# Patient Record
Sex: Female | Born: 1985 | Race: White | Hispanic: Yes | Marital: Married | Smoking: Former smoker
Health system: Southern US, Community
[De-identification: ages and names within clinical notes are randomized; demographics above are authoritative.]

## PROBLEM LIST (undated history)

## (undated) ENCOUNTER — Inpatient Hospital Stay (HOSPITAL_COMMUNITY): Payer: Self-pay

## (undated) DIAGNOSIS — Z789 Other specified health status: Secondary | ICD-10-CM

## (undated) HISTORY — PX: DILATION AND CURETTAGE OF UTERUS: SHX78

---

## 2012-08-25 ENCOUNTER — Inpatient Hospital Stay (HOSPITAL_COMMUNITY)
Admission: AD | Admit: 2012-08-25 | Discharge: 2012-08-25 | Disposition: A | Discharge status: Home / Self Care | Payer: Self-pay | Source: Ambulatory Visit | Attending: Obstetrics & Gynecology | Admitting: Obstetrics & Gynecology

## 2012-08-25 ENCOUNTER — Inpatient Hospital Stay (HOSPITAL_COMMUNITY): Payer: Self-pay

## 2012-08-25 ENCOUNTER — Encounter (HOSPITAL_COMMUNITY): Payer: Self-pay | Admitting: *Deleted

## 2012-08-25 DIAGNOSIS — O209 Hemorrhage in early pregnancy, unspecified: Secondary | ICD-10-CM

## 2012-08-25 DIAGNOSIS — O26859 Spotting complicating pregnancy, unspecified trimester: Secondary | ICD-10-CM | POA: Insufficient documentation

## 2012-08-25 DIAGNOSIS — O4690 Antepartum hemorrhage, unspecified, unspecified trimester: Secondary | ICD-10-CM

## 2012-08-25 HISTORY — DX: Other specified health status: Z78.9

## 2012-08-25 LAB — WET PREP, GENITAL
Trich, Wet Prep: NONE SEEN
Yeast Wet Prep HPF POC: NONE SEEN

## 2012-08-25 LAB — URINALYSIS, ROUTINE W REFLEX MICROSCOPIC
Bilirubin Urine: NEGATIVE
Hgb urine dipstick: NEGATIVE
Ketones, ur: NEGATIVE mg/dL
Nitrite: NEGATIVE
pH: 7.5 (ref 5.0–8.0)

## 2012-08-25 LAB — CBC
HCT: 40.6 % (ref 36.0–46.0)
Hemoglobin: 14 g/dL (ref 12.0–15.0)
MCV: 87.1 fL (ref 78.0–100.0)
RDW: 13.8 % (ref 11.5–15.5)
WBC: 6.7 10*3/uL (ref 4.0–10.5)

## 2012-08-25 LAB — HCG, QUANTITATIVE, PREGNANCY: hCG, Beta Chain, Quant, S: 135 m[IU]/mL — ABNORMAL HIGH (ref <5)

## 2012-08-25 NOTE — MAU Provider Note (Signed)
History     CSN: [id number]  Arrival date and time: 08/25/12 1147   None     Chief Complaint  Patient presents with  . Possible Pregnancy   HPI This is a 27 y.o. female at early gestation who presents with report of spotting. Denies pain or fever. Has not received care anywhere yet   RN Note: Pt reports she had a positive HPT and started spotting today. Denies pain or cramping.      OB History   Grav Para Term Preterm Abortions TAB SAB Ect Mult Living   3 1 1  2  2   1       Past Medical History  Diagnosis Date  . Medical history non-contributory     Past Surgical History  Procedure Laterality Date  . Cesarean section      No family history on file.  History  Substance Use Topics  . Smoking status: Former Smoker    Quit date: 08/20/2012  . Smokeless tobacco: Not on file  . Alcohol Use: No    Allergies: Allergies not on file  No prescriptions prior to admission    Review of Systems  Constitutional: Negative for fever, chills and malaise/fatigue.  Gastrointestinal: Negative for nausea, vomiting, abdominal pain, diarrhea and constipation.  Genitourinary:       Spotting  Neurological: Negative for dizziness and weakness.   Physical Exam   Blood pressure 114/63, pulse 77, temperature 98.1 F (36.7 C), temperature source Oral, resp. rate 18, height 5\' 3"  (1.6 m), weight 58.786 kg (129 lb 9.6 oz), last menstrual period 07/27/2012.  Physical Exam  Constitutional: She is oriented to person, place, and time. She appears well-developed and well-nourished. No distress.  Cardiovascular: Normal rate.   Respiratory: Effort normal.  GI: Soft. She exhibits no distension and no mass. There is no tenderness. There is no rebound and no guarding.  Genitourinary: Uterus normal. Vaginal discharge (small amount dark red colored discharge) found.  Uterus small,nontender Adnexae nontender, no masses  Musculoskeletal: Normal range of motion.  Neurological: She is alert  and oriented to person, place, and time.  Skin: Skin is warm and dry.  Psychiatric: She has a normal mood and affect.    MAU Course  Procedures  MDM Will check quant, CBC, ABO/Rh and [place] to rule out ectopic Results for orders placed during the hospital encounter of 08/25/12 (from the past 24 hour(s))  URINALYSIS, ROUTINE W REFLEX MICROSCOPIC     Status: Abnormal   Collection Time    08/25/12 12:00 PM      Result Value Range   Color, Urine YELLOW  YELLOW   APPearance CLEAR  CLEAR   Specific Gravity, Urine 1.010  1.005 - 1.030   pH 7.5  5.0 - 8.0   Glucose, UA NEGATIVE  NEGATIVE mg/dL   Hgb urine dipstick NEGATIVE  NEGATIVE   Bilirubin Urine NEGATIVE  NEGATIVE   Ketones, ur NEGATIVE  NEGATIVE mg/dL   Protein, ur NEGATIVE  NEGATIVE mg/dL   Urobilinogen, UA 0.2  0.0 - 1.0 mg/dL   Nitrite NEGATIVE  NEGATIVE   Leukocytes, UA TRACE (*) NEGATIVE  URINE MICROSCOPIC-ADD ON     Status: Abnormal   Collection Time    08/25/12 12:00 PM      Result Value Range   Squamous Epithelial / LPF FEW (*) RARE   WBC, UA 3-6  <3 WBC/hpf   Bacteria, UA FEW (*) RARE  POCT PREGNANCY, URINE     Status: Abnormal  Collection Time    08/25/12 12:13 PM      Result Value Range   Preg Test, Ur POSITIVE (*) NEGATIVE  HCG, QUANTITATIVE, PREGNANCY     Status: Abnormal   Collection Time    08/25/12 12:38 PM      Result Value Range   hCG, Beta Chain, Quant, S 135 (*) <5 mIU/mL  CBC     Status: None   Collection Time    08/25/12 12:38 PM      Result Value Range   WBC 6.7  4.0 - 10.5 K/uL   RBC 4.66  3.87 - 5.11 MIL/uL   Hemoglobin 14.0  12.0 - 15.0 g/dL   HCT 16.1  09.6 - 04.5 %   MCV 87.1  78.0 - 100.0 fL   MCH 30.0  26.0 - 34.0 pg   MCHC 34.5  30.0 - 36.0 g/dL   RDW 40.9  81.1 - 91.4 %   Platelets 272  150 - 400 K/uL  ABO/RH     Status: None   Collection Time    08/25/12 12:38 PM      Result Value Range   ABO/RH(D) A POS    WET PREP, GENITAL     Status: Abnormal   Collection Time    08/25/12  12:50 PM      Result Value Range   Yeast Wet Prep HPF POC NONE SEEN  NONE SEEN   Trich, Wet Prep NONE SEEN  NONE SEEN   Clue Cells Wet Prep HPF POC FEW (*) NONE SEEN   WBC, Wet Prep HPF POC FEW (*) NONE SEEN   US Ob Transvaginal  08/25/2012   *RADIOLOGY REPORT*  Clinical Data: bleeding early preg, r/o ectopic,bleeding, r/o ectopic; ;  OBSTETRIC <14 WK [place] AND TRANSVAGINAL OB US  Technique: Both transabdominal and transvaginal ultrasound examinations were performed for complete evaluation of the gestation as well as the maternal uterus, adnexal regions, and pelvic cul-de-sac.  Comparison: None.  Findings: No intrauterine gestational sac is visualized.  Uterus is unremarkable.  Endometrium 14 mm in thickness and homogeneous.  Ovaries are symmetric in size and echotexture.  Small left corpus luteal cyst.  No adnexal masses.  No free fluid.  IMPRESSION: No intrauterine gestation.  Differential considerations would include intrauterine gestation too early to visualize, spontaneous abortion, or occult ectopic pregnancy.  Recommend continued close follow-up with serial quantitative HCGs and ultrasounds.   Original Report Authenticated By: Charlett Nose, M.D.   Assessment and Plan  A:  Pregnancy at 4.[redacted] weeks gestation      Spotting   P:  Discharge home       Repeat quant HCG in 48 hrs       Repeat US in 7-10 days  [facility] 08/25/2012, 12:56 PM

## 2012-08-25 NOTE — MAU Note (Signed)
Pt reports she had a positive HPT and started spotting today. Denies pain or cramping.

## 2012-08-27 ENCOUNTER — Encounter (HOSPITAL_COMMUNITY): Payer: Self-pay

## 2012-08-27 ENCOUNTER — Inpatient Hospital Stay (HOSPITAL_COMMUNITY)
Admission: AD | Admit: 2012-08-27 | Discharge: 2012-08-27 | Disposition: A | Discharge status: Home / Self Care | Payer: Self-pay | Source: Ambulatory Visit | Attending: Obstetrics and Gynecology | Admitting: Obstetrics and Gynecology

## 2012-08-27 DIAGNOSIS — O26851 Spotting complicating pregnancy, first trimester: Secondary | ICD-10-CM

## 2012-08-27 DIAGNOSIS — O26859 Spotting complicating pregnancy, unspecified trimester: Secondary | ICD-10-CM | POA: Insufficient documentation

## 2012-08-27 LAB — URINE CULTURE: Colony Count: 95000

## 2012-08-27 LAB — HCG, QUANTITATIVE, PREGNANCY: hCG, Beta Chain, Quant, S: 441 m[IU]/mL — ABNORMAL HIGH

## 2012-08-27 LAB — GC/CHLAMYDIA PROBE AMP
CT Probe RNA: NEGATIVE
GC Probe RNA: NEGATIVE

## 2012-08-27 NOTE — MAU Provider Note (Signed)
History   Chief Complaint:  Follow-up   Paige Love is  27 y.o. [id number] Patient's last menstrual period was 07/27/2012.Marland Kitchen Patient is here for follow up of quantitative HCG and ongoing surveillance of pregnancy status.   She is [redacted]w[redacted]d weeks gestation  by LMP.    Since her last visit, the patient is without new complaint.   The patient reports bleeding as  spotting.    General ROS:  negative  Her previous Quantitative HCG values are:  08/25/12: 135   Physical Exam   Blood pressure 117/65, pulse 94, temperature 99.2 F (37.3 C), temperature source Oral, resp. rate 16, last menstrual period 07/27/2012, SpO2 100.00%.  Focused Gynecological Exam: examination not indicated  Labs: Results for orders placed during the hospital encounter of 08/27/12 (from the past 24 hour(s))  HCG, QUANTITATIVE, PREGNANCY   Collection Time    08/27/12 10:20 AM      Result Value Range   hCG, Beta Chain, Quant, S 441 (*) <5 mIU/mL    Ultrasound Studies:   US Ob Comp Less 14 Wks  08/25/2012   *RADIOLOGY REPORT*  Clinical Data: bleeding early preg, r/o ectopic,bleeding, r/o ectopic; ;  OBSTETRIC <14 WK [place] AND TRANSVAGINAL OB US  Technique: Both transabdominal and transvaginal ultrasound examinations were performed for complete evaluation of the gestation as well as the maternal uterus, adnexal regions, and pelvic cul-de-sac.  Comparison: None.  Findings: No intrauterine gestational sac is visualized.  Uterus is unremarkable.  Endometrium 14 mm in thickness and homogeneous.  Ovaries are symmetric in size and echotexture.  Small left corpus luteal cyst.  No adnexal masses.  No free fluid.  IMPRESSION: No intrauterine gestation.  Differential considerations would include intrauterine gestation too early to visualize, spontaneous abortion, or occult ectopic pregnancy.  Recommend continued close follow-up with serial quantitative HCGs and ultrasounds.   Original Report Authenticated By: Charlett Nose, M.D.   US  Ob Transvaginal  08/25/2012   *RADIOLOGY REPORT*  Clinical Data: bleeding early preg, r/o ectopic,bleeding, r/o ectopic; ;  OBSTETRIC <14 WK [place] AND TRANSVAGINAL OB US  Technique: Both transabdominal and transvaginal ultrasound examinations were performed for complete evaluation of the gestation as well as the maternal uterus, adnexal regions, and pelvic cul-de-sac.  Comparison: None.  Findings: No intrauterine gestational sac is visualized.  Uterus is unremarkable.  Endometrium 14 mm in thickness and homogeneous.  Ovaries are symmetric in size and echotexture.  Small left corpus luteal cyst.  No adnexal masses.  No free fluid.  IMPRESSION: No intrauterine gestation.  Differential considerations would include intrauterine gestation too early to visualize, spontaneous abortion, or occult ectopic pregnancy.  Recommend continued close follow-up with serial quantitative HCGs and ultrasounds.   Original Report Authenticated By: Charlett Nose, M.D.    Assessment:  [redacted]w[redacted]d weeks gestation w/ appropriate rise in quants.   Plan: The patient is instructed to follow up in in 2 weeks for viability [place]. SAB, ectopic precautions.  Paige Love 08/27/2012, 11:07 AM

## 2012-08-28 ENCOUNTER — Other Ambulatory Visit: Payer: Self-pay | Admitting: Medical

## 2012-08-28 NOTE — Progress Notes (Signed)
Patient had + urine culture. Needs Rx for Macrobid, but no pharmacy listed. Will have NP/CNM in MAU today contact with Spanish interpreter.   Freddi Starr, PA-C 08/28/2012 8:05 AM

## 2012-08-29 ENCOUNTER — Other Ambulatory Visit: Payer: Self-pay | Admitting: Advanced Practice Midwife

## 2012-08-29 DIAGNOSIS — O2341 Unspecified infection of urinary tract in pregnancy, first trimester: Secondary | ICD-10-CM

## 2012-08-29 MED ORDER — NITROFURANTOIN MONOHYD MACRO 100 MG PO CAPS: 100.0000 mg | ORAL_CAPSULE | Freq: Two times a day (BID) | ORAL | Status: DC

## 2012-08-29 NOTE — Progress Notes (Signed)
No pharmacy listed to E-Rx Macrobid for UTI. Please have interpreter in clinic call pt to inquire. Please call in Rx.

## 2012-08-30 NOTE — MAU Provider Note (Signed)
Attestation of Attending Supervision of Advanced Practitioner (CNM/NP): Evaluation and management procedures were performed by the Advanced Practitioner under my supervision and collaboration.  I have reviewed the Advanced Practitioner's note and chart, and I agree with the management and plan.  Lillianne Eick 08/30/2012 8:34 AM

## 2012-08-31 MED ORDER — NITROFURANTOIN MONOHYD MACRO 100 MG PO CAPS: 100.0000 mg | ORAL_CAPSULE | Freq: Two times a day (BID) | ORAL | Status: DC

## 2012-08-31 NOTE — Addendum Note (Signed)
Addended by: Jill Side on: 08/31/2012 12:48 PM   Modules accepted: Orders

## 2012-08-31 NOTE — Progress Notes (Signed)
Called pt with [place] interpreter # [phone] and informed pt of UTI as well as need for medication. After pt selected her pharmacy, I sent Rx electronically. She voiced understanding and will obtain Rx today and follow instructions.

## 2012-09-11 ENCOUNTER — Ambulatory Visit (HOSPITAL_COMMUNITY)
Admission: RE | Admit: 2012-09-11 | Discharge: 2012-09-11 | Disposition: A | Discharge status: Home / Self Care | Payer: Self-pay | Source: Ambulatory Visit | Attending: Advanced Practice Midwife | Admitting: Advanced Practice Midwife

## 2012-09-11 DIAGNOSIS — Z3689 Encounter for other specified antenatal screening: Secondary | ICD-10-CM | POA: Insufficient documentation

## 2012-09-11 DIAGNOSIS — O26851 Spotting complicating pregnancy, first trimester: Secondary | ICD-10-CM

## 2012-09-11 DIAGNOSIS — O34219 Maternal care for unspecified type scar from previous cesarean delivery: Secondary | ICD-10-CM | POA: Insufficient documentation

## 2012-10-23 ENCOUNTER — Encounter: Payer: Self-pay | Admitting: Family Medicine

## 2012-10-23 ENCOUNTER — Encounter: Payer: Self-pay | Admitting: *Deleted

## 2012-10-23 ENCOUNTER — Ambulatory Visit (INDEPENDENT_AMBULATORY_CARE_PROVIDER_SITE_OTHER): Payer: Self-pay | Admitting: Family Medicine

## 2012-10-23 VITALS — BP 115/72 | Temp 99.6°F | Wt 127.1 lb

## 2012-10-23 DIAGNOSIS — Z3480 Encounter for supervision of other normal pregnancy, unspecified trimester: Secondary | ICD-10-CM | POA: Insufficient documentation

## 2012-10-23 DIAGNOSIS — Z3481 Encounter for supervision of other normal pregnancy, first trimester: Secondary | ICD-10-CM

## 2012-10-23 DIAGNOSIS — O34219 Maternal care for unspecified type scar from previous cesarean delivery: Secondary | ICD-10-CM

## 2012-10-23 LAB — POCT URINALYSIS DIP (DEVICE)
Leukocytes, UA: NEGATIVE
Protein, ur: NEGATIVE mg/dL
Urobilinogen, UA: 0.2 mg/dL (ref 0.0–1.0)
pH: 7 (ref 5.0–8.0)

## 2012-10-23 NOTE — Progress Notes (Signed)
Subjective:    Paige Love is a [id number] [redacted]w[redacted]d being seen today for her first obstetrical visit.  Her obstetrical history is significant for previous hx of c/s that per pt was for ROM.  Unplanned pregnancy. Father of the baby not involved. Patient does intend to breast feed. Pregnancy history fully reviewed.  Patient reports no complaints. She did have a little bleeding early on but that has resolved.  No ctx.   Filed Vitals:   10/23/12 1412  BP: 115/72  Temp: 99.6 F (37.6 C)  Weight: 127 lb 1.6 oz (57.652 kg)    HISTORY: OB History  Gravida Para Term Preterm AB SAB TAB Ectopic Multiple Living  5 1 1  3 3    1     # Outcome Date GA Lbr Len/2nd Weight Sex Delivery Anes PTL Lv  5 CUR           4 SAB 2013 [redacted]w[redacted]d            Comments: D&C  3 SAB 2012 [redacted]w[redacted]d            Comments: D&C  2 SAB 2012 [redacted]w[redacted]d            Comments: D&C  1 TRM 08/11/06 [redacted]w[redacted]d  7 lb 15 oz (3.6 kg) M CS   Y     Comments: Classical Csection for ROM per pt; baby born in [place];        Past Medical History  Diagnosis Date  . Medical history non-contributory    Past Surgical History  Procedure Laterality Date  . Cesarean section    . Dilation and curettage of uterus     Family History  Problem Relation Age of Onset  . Heart disease Mother   . Kidney disease Mother   . Thyroid disease Mother   . Hypertension Father      Exam    Uterus:  gravid, appropriate for dates.    Pelvic Exam:    Perineum: No Hemorrhoids   Vulva: normal   Vagina:  normal mucosa, normal discharge   Cervix: normal. 0/0/high   Adnexa: normal adnexa and no mass, fullness, tenderness  System: Breast:  deferred   Skin: normal coloration and turgor, no rashes    Neurologic: oriented, normal, no focal deficits   Extremities: normal strength, tone, and muscle mass   HEENT PERRLA   Mouth/Teeth mucous membranes moist, pharynx normal without lesions   Neck supple   Cardiovascular: regular rate and rhythm, no murmurs or  gallops   Respiratory:  appears well, vitals normal, no respiratory distress, acyanotic, normal RR, ear and throat exam is normal, neck free of mass or lymphadenopathy, chest clear, no wheezing, crepitations, rhonchi, normal symmetric air entry   Abdomen: soft, non-tender; bowel sounds normal; no masses,  no organomegaly   Urinary: urethral meatus normal      Assessment:    Pregnancy: [id number] Patient Active Problem List   Diagnosis Date Noted  . Supervision of normal subsequent pregnancy 10/23/2012        Plan:     Initial labs drawn. PAP obtained today Prenatal vitamins. Problem list reviewed and updated. Previous c/s was classical - discussed this will mean she will require a repeat c/s with this pregnancy Genetic Screening discussed Quad Screen: pt thinking about it and will decide for next visit.  Ultrasound discussed; fetal survey: to be requested at next visit.  Follow up in 4 weeks. 50% of 30 min visit spent on counseling and coordination of  care.    Yessenia Maillet L 10/23/2012

## 2012-10-23 NOTE — Progress Notes (Signed)
P=102  Pt. Reports being very nauseated in the last three months and hasn't been able to keep anything down; believes she has gained some weight that she had lost from not eating. Pt. States at about 4 weeks she went to the Ed for bleeding; bled for two days but has not bled since then.

## 2012-10-23 NOTE — Patient Instructions (Addendum)
AFP Maternal This is a routine screen (tests) used to check for fetal abnormalities such as Down syndrome and neural tube defects. Down Syndrome is a chromosomal abnormality, sometimes called Trisomy 36. Neural tube defects are serious birth defects. The brain, spinal cord, or their coverings do not develop completely. Women should be tested in the 15th to 20th week of pregnancy. The msAFP screen involves three or four tests that measure substances found in the blood that make the testing better. During development, AFP levels in fetal blood and amniotic fluid rise until about 12 weeks. The levels then gradually fall until birth. AFP is a protein produce by fetal tissue. AFP crosses the placenta and appears in the maternal blood. A baby with an open neural tube defect has an opening in its spine, head, or abdominal wall that allows higher-than-usual amounts of AFP to pass into the mother's blood. If a screen is positive, more tests are needed to make a diagnosis. These include ultrasound and perhaps amniocentesis (checking the fluid that surrounds the baby). These tests are used to help women and their caregivers make decisions about the management of their pregnancies. In pregnancies where the fetus is carrying the chromosomal defect that results in Down syndrome, the levels of AFP and unconjugated estriol tend to be low and hCG and inhibin A levels high.  PREPARATION FOR TEST Blood is drawn from a vein in your arm usually between the 15th and 20th weeks of pregnancy. Four different tests on your blood are done. These are AFP, hCG, unconjugated estriol, and inhibin A. The combination of tests produces a more accurate result. NORMAL FINDINGS   Adult: less than 40ng/mL or less than 40 mg/L (SI units)  Child younger than1 year: less than 30 ng/mL Ranges are stratified by weeks of gestation and vary among laboratories. Ranges for normal findings may vary among different laboratories and hospitals. You  should always check with your doctor after having lab work or other tests done to discuss the meaning of your test results and whether your values are considered within normal limits. MEANING OF TEST  These are screening tests. Not all fetal abnormalities will give positive test results. Of all women who have positive AFP screening results, only a very small number of them have babies who actually have a neural tube defect or chromosomal abnormality. Your caregiver will go over the test results with you and discuss the importance and meaning of your results, as well as treatment options and the need for additional tests if necessary. OBTAINING THE TEST RESULTS It is your responsibility to obtain your test results. Ask the lab or department performing the test when and how you will get your results. Document Released: 02/02/2004 Document Revised: 04/04/2011 Document Reviewed: 12/15/2007 [facility] Patient Information 2014 [place], [place]. Embarazo  Primer trimestre  (Pregnancy - First Trimester)  Durante el acto sexual, millones de espermatozoides entran en la vagina. Slo 1 espermatozoide penetra y fertiliza al vulo mientras se encuentra en la trompa de [place]. Una semana ms tarde, el vulo fertilizado se implanta en la pared del tero. Un embrin comienza a desarrollarse para ser un beb. A las 6 a 8 semanas se forman los ojos y la cara y los latidos del corazn se pueden ver en la ecografa. Al final de las 12 semanas (primer trimestre) todos los rganos del beb estn formados. Ahora que est embarazada, querr hacer todo lo que est a su alcance para tener un beb sano. Dos de las cosas ms [employer]  son: Birdena Jubilee atencin prenatal y seguir las indicaciones del profesional que la asiste. La atencin prenatal incluye toda la asistencia mdica que usted recibe antes del nacimiento del beb. Se lleva a cabo para prevenir y tratar problemas durante el [address] y [place]. EXAMENES PRENATALES    Durante las visitas prenatales se [employer], la presin arterial y se solicitan anlisis de [place]. Esto se hace para asegurarse de que usted est sana y el embarazo progrese normalmente.  Una mujer embarazada debe aumentar de 25 a 35 libras durante el embarazo. Sin embargo, si usted tiene sobrepeso o [place], su mdico Administrator, Civil Service.  El podr hacerle preguntas y responder todas las que usted le haga.  Durante los exmenes prenatales se solicitan anlisis de [place], cultivos del [place], un [place] y otros anlisis necesarios. Estas pruebas se realizan para controlar su salud y la del beb. Se recomienda que se haga la prueba para el diagnstico del VIH, con su autorizacin. Este es el virus que causa el [place]. Estas pruebas se realizan porque existen medicamentos que podran administrarle para prevenir que el beb nazca con esta infeccin, si usted estuviera infectada y no lo supiera. Los [employer] de sangre tambin se Radiographer, therapeutic para Warehouse manager tipo de [place], si tuvo infecciones previas y Chief Operating Officer sus niveles en la sangre (hemoglobina).  Tener un recuento bajo de hemoglobina (anemia) es comn durante Firefighter. Para prevenirla, se administran hierro y vitaminas. En una etapa ms avanzada del [place], le indicarn exmenes de sangre para saber si tiene diabetes, junto con otros anlisis, en caso de que [place].  Es necesario que se haga las pruebas para asegurarse de que usted y el beb estn bien. CAMBIOS DURANTE EL PRIMER TRIMESTRE  Su organismo atravesar numerosos cambios durante el [place]. Estos pueden variar de Neomia Dear persona a otra. Converse con el mdico acerca los cambios que usted nota y que la preocupan. Ellos son:   El perodo menstrual se detiene.  El vulo y los espermatozoides llevan los genes que determinan cmo seremos. Sus genes y los de su pareja forman el beb. Los genes del varn determinan si ser un nio o una nia.  La circunferencia  de la cintura va a ir aumentando y podr sentirse hinchada.  Puede tener Programme researcher, broadcasting/film/video (nuseas) y vmitos. Si no puede controlar los vmitos, consulte a su mdico.  Sus mamas comenzarn a agrandarse y sensibilizarse.  Los pezones pueden sobresalir ms y ser ms oscuros.  Tendr necesidad de orinar ms. El dolor al orinar puede significar que usted tiene una infeccin de la vejiga.  Se cansar con facilidad.  Prdida del apetito.  Sentir un fuerte deseo de consumir ciertos alimentos.  Al principio, usted puede ganar o perder un par de kilos.  Podr tener cambios emocionales de un da a otro (entusiasmo por estar embarazada o preocupacin por Firefighter y el beb).  Tendr sueos ms vvidos y extraos. INSTRUCCIONES PARA EL CUIDADO EN EL HOGAR   Es muy importante evitar el cigarrillo, el alcohol y los frmacos no recetados Academic librarian. Estas sustancias afectan la formacin y el desarrollo del beb. Evite los productos qumicos durante el embarazo para Games developer salud del beb.  Comience las consultas prenatales alrededor de la 12 semana de [place]. Generalmente se programan cada mes al principio y se hacen ms frecuentes en los 2 ltimos meses antes del parto. Cumpla con las citas de control. Siga las indicaciones del mdico con respecto al  uso de [place], los [place] y pruebas de Interior and spatial designer, los ejercicios y Psychologist, forensic.  Durante el embarazo debe obtener nutrientes para usted y para su beb. Consuma alimentos balanceados. Elija alimentos como carne, pescado, [place] y otros productos lcteos descremados, vegetales, frutas, panes integrales y cereales. El [employer] informar cul es el aumento de peso ideal.  Las nuseas matinales pueden aliviarse si come algunas galletitas saladas en la cama. Coma dos galletitas antes de levantarse por la maana. Tambin puede comer galletitas sin sal.  Hacer 4 o 5 comidas pequeas en lugar de 3 comidas grandes por da tambin  puede [employer] nuseas y los vmitos.  Beber lquidos [place] comidas en lugar de tomarlos durante las comidas tambin puede ayudar a Optician, dispensing las nuseas y los vmitos.  Puede continuar teniendo [employer] durante todo el embarazo si no hay otros problemas. Los problemas pueden ser una prdida precoz (prematura) de lquido amnitico, sangrado vaginal, o dolor en el vientre (abdominal).  Realice [employer], si no tiene restricciones. Consulte con su mdico o terapeuta fsico si no est segura de algunos de sus ejercicios. El mayor aumento de peso se producir en los ltimos 2 trimestres del Psychiatrist. El ejercicio le ayudar a:  Engineering geologist.  Mantenerse en forma.  Prepararse para el parto.  La ayudar a perder el peso del embarazo despus de que nazca su beb.  Use un buen sostn o como los que se usan para hacer deportes para Paramedic la sensibilidad de las [place]. Tambin puede serle til si lo [place] mientras duerme.  Consulte cuando puede comenzar con las clases de pre parto. Comience con las clases cuando estn disponibles.  No utilice la baera con agua caliente, baos turcos y saunas.   Colquese el cinturn de seguridad cuando conduzca. Este la proteger a usted y al beb en caso de accidente.  Evite comer carne cruda y el contacto con los utensilios y desperdicios de los gatos. Estos elementos contienen grmenes que pueden causar defectos de nacimiento en el beb.  El primer trimestre es un buen momento para visitar a su dentista y Software engineer. Es importante mantener los dientes limpios. Use un cepillo de dientes suave y cepllese con ms suavidad durante el [employer].  Pida ayuda si tienen necesidades financieras, teraputicas o nutricionales. El profesional podr ayudarla con respecto a estas necesidades, o derivarla a otros especialistas.  No tome medicamentos o hierbas excepto aquellos que Fish farm manager.  Informe a su  mdico si sufre violencia familiar mental o fsica.  Haga una lista de nmeros de telfono de Associate Professor de la familia, los amigos, el hospital y los departamentos de polica y bomberos.  Escriba sus preguntas. Llvelas cuando concurra a su visita prenatal.  No se haga duchas vaginales.  No cruce las piernas.  Si usted tiene que estar parada por largos perodos de [place], gire los pies o de pequeos pasos en crculo.  Es posible que tenga ms secreciones vaginales que puedan requerir una toalla higinica. No use tampones o toallas higinicas perfumadas. EL CONSUMO DE MEDICAMENTOS Y FRMACOS DURANTE EL EMBARAZO   Tome las vitaminas para la etapa prenatal tal como se le indic. Las vitaminas deben contener un miligramo de cido flico. Guarde todas las vitaminas fuera del alcance de los nios. La ingestin de slo un par de vitaminas o tabletas que contengan hierro pueden ocasionar la [employer] en un beb o en un nio pequeo.  Evite el uso de  todos los medicamentos, incluyendo hierbas, medicamentos de [address], sin receta o que no hayan sido sugeridos por su mdico. Slo tome medicamentos de venta libre o medicamentos recetados para Chief Technology Officer, Environmental health practitioner o fiebre como lo indique su mdico. No tome aspirina, ibuprofeno o naproxeno excepto que su mdico se lo indique.  Infrmele al profesional si consume medicamentos de hierbas.  El alcohol se relaciona con ciertos defectos congnitos. Incluye el sndrome de alcoholismo fetal. Debe evitar absolutamente el consumo de alcohol, en cualquier forma. El fumar causar baja tasa de natalidad y bebs prematuros.  Las drogas ilegales o de la calle son muy perjudiciales para el beb. Estn absolutamente prohibidas. Un beb que nace de [employer], ser adicto al nacer. Ese beb tendr los mismos sntomas de abstinencia que un adulto.  Informe a su mdico acerca de los medicamentos que ha tomado y el motivo por el que los tom. SOLICITE ATENCIN  MDICA SI:  Tiene preguntas o preocupaciones relacionadas con el embarazo. Es mejor que llame para formular las preguntas si no puede esperar hasta la prxima visita, que sentirse preocupada por ellas.  SOLICITE ATENCIN MDICA DE INMEDIATO SI:   La temperatura oral le sube a ms de 102 F (38.9 C) o lo que su mdico le indique.  Tiene una prdida de lquido por la vagina (canal de parto). Si sospecha una ruptura de las [place], tmese la temperatura y llame al profesional para informarlo sobre esto.  Observa unas pequeas manchas o una hemorragia vaginal. Notifique al profesional acerca de la cantidad y de cuntos apsitos est utilizando.  Presenta un olor desagradable en la secrecin vaginal y observa un cambio en el color.  Contina con las nuseas y no obtiene alivio de los remedios indicados. Vomita sangre o algo similar a la borra del caf.  Pierde ms de 2 libras (1 Kg) en una semana.  Aumenta ms de 1 Kg en una semana y [address], las manos, los pies o las piernas hinchados.  Aumenta ms de 2,5 Kg en una semana (aunque no tenga las manos, pies, piernas o el rostro hinchados).  Ha estado expuesta a la rubola y no ha sufrido la enfermedad.  Ha estado expuesta a la quinta enfermedad o a la varicela.  Siente dolor en el vientre (abdominal). Las [employer] en el ligamento redondo son Neomia Dear causa benigna frecuente de dolor abdominal durante el embarazo. El profesional que la asiste deber evaluarla.  Presenta dolor de Renne Musca, diarrea, dolor al orinar o le falta la respiracin.  Se cae, se ve involucrada en un accidente automovilstico o sufre algn tipo de traumatismo.  En su hogar hay violencia mental o fsica. Document Released: 10/20/2004 Document Revised: 10/05/2011 [facility] Patient Information 2014 [place], [place].

## 2012-10-24 LAB — OBSTETRIC PANEL
Antibody Screen: NEGATIVE
Basophils Absolute: 0 10*3/uL (ref 0.0–0.1)
Basophils Relative: 1 % (ref 0–1)
Eosinophils Relative: 1 % (ref 0–5)
HCT: 39 % (ref 36.0–46.0)
Lymphocytes Relative: 24 % (ref 12–46)
MCHC: 34.4 g/dL (ref 30.0–36.0)
Monocytes Absolute: 0.4 10*3/uL (ref 0.1–1.0)
Neutro Abs: 4.5 10*3/uL (ref 1.7–7.7)
Platelets: 335 10*3/uL (ref 150–400)
RDW: 14.5 % (ref 11.5–15.5)
Rubella: 3.02 Index — ABNORMAL HIGH (ref <0.90)
WBC: 6.6 10*3/uL (ref 4.0–10.5)

## 2012-10-24 LAB — HIV ANTIBODY (ROUTINE TESTING W REFLEX): HIV: NONREACTIVE

## 2012-10-25 LAB — HEMOGLOBINOPATHY EVALUATION
Hgb A2 Quant: 2.6 % (ref 2.2–3.2)
Hgb A: 97.4 % (ref 96.8–97.8)

## 2012-10-25 LAB — CULTURE, OB URINE: Organism ID, Bacteria: NO GROWTH

## 2012-11-20 ENCOUNTER — Ambulatory Visit (INDEPENDENT_AMBULATORY_CARE_PROVIDER_SITE_OTHER): Payer: Self-pay | Admitting: Family Medicine

## 2012-11-20 ENCOUNTER — Encounter: Payer: Self-pay | Admitting: Family Medicine

## 2012-11-20 VITALS — BP 109/72 | Wt 128.6 lb

## 2012-11-20 DIAGNOSIS — O34219 Maternal care for unspecified type scar from previous cesarean delivery: Secondary | ICD-10-CM

## 2012-11-20 DIAGNOSIS — Z3483 Encounter for supervision of other normal pregnancy, third trimester: Secondary | ICD-10-CM

## 2012-11-20 LAB — POCT URINALYSIS DIP (DEVICE)
Ketones, ur: NEGATIVE mg/dL
Protein, ur: NEGATIVE mg/dL
Specific Gravity, Urine: 1.02 (ref 1.005–1.030)
Urobilinogen, UA: 1 mg/dL (ref 0.0–1.0)
pH: 7.5 (ref 5.0–8.0)

## 2012-11-20 NOTE — Progress Notes (Signed)
P= 107  C/o headaches for 3 days that are relieved with Tylenol increasing this month.

## 2012-11-20 NOTE — Progress Notes (Signed)
27 yo [id number] @ [redacted]w[redacted]d here for ROBV - doing well. Nausea improved - having headaches that get better with tylenol last for 3 days.  - no VB  O: see flowsheet  A/P:  - discussed quad screen pt declined - [place] ordered and scheduled today - f/u in 4 weeks  - reviewed labs

## 2012-11-20 NOTE — Patient Instructions (Addendum)
Embarazo - Segundo trimestre (Pregnancy - Second Trimester) El segundo trimestre del embarazo (del 3 al 6 mes) es un perodo de evolucin rpida para usted y el beb. Hacia el final del sexto mes, el beb mide aproximadamente 23 cm y pesa 680 g. Comenzar a sentir los movimientos del beb entre las 18 y las 20 semanas de embarazo. Podr sentir las pataditas ("quickening en ingls"). Hay un rpido aumento de peso. Puede segregar un lquido claro (calostro) de las mamas. Quizs sienta pequeas contracciones en el vientre (tero) Esto se conoce como falso trabajo de parto o contracciones de Braxton-Hicks. Es como una prctica del trabajo de parto que se produce cuando el beb est listo para salir. Generalmente los problemas de vmitos matinales ya se han superado hacia el final del primer trimestre. Algunas mujeres desarrollan pequeas manchas oscuras (que se denominan cloasma, mscara del embarazo) en la cara que normalmente se van luego del nacimiento del beb. La exposicin al sol empeora las manchas. Puede desarrollarse acn en algunas mujeres embarazadas, y puede desaparecer en aquellas que ya tienen acn. EXAMENES PRENATALES  Durante los exmenes prenatales, deber seguir realizando pruebas de sangre, segn avance el embarazo. Estas pruebas se realizan para controlar su salud y la del beb. Tambin se realizan anlisis de sangre para conocer los niveles de hemoglobina. La anemia (bajo nivel de hemoglobina) es frecuente durante el embarazo. Para prevenirla, se administran hierro y vitaminas. Tambin se le realizarn exmenes para saber si tiene diabetes entre las 24 y las 28 semanas del embarazo. Podrn repetirle algunas de las pruebas que le hicieron previamente.  En cada visita le medirn el tamao del tero. Esto se realiza para asegurarse de que el beb est creciendo correctamente de acuerdo al estado del embarazo.  Tambin en cada visita prenatal controlarn su presin arterial. Esto se realiza  para asegurarse de que no tenga toxemia.  Se controlar su orina para asegurarse de que no tenga infecciones, diabetes o protena en la orina.  Se controlar su peso regularmente para asegurarse que el aumento ocurre al ritmo indicado. Esto se hace para asegurarse que usted y el beb tienen una evolucin normal.  En algunas ocasiones se realiza una prueba de ultrasonido para confirmar el correcto desarrollo y evolucin del beb. Esta prueba se realiza con ondas sonoras inofensivas para el beb, de modo que el profesional pueda calcular ms precisamente la fecha del parto. Algunas veces se realizan pruebas especializadas del lquido amnitico que rodea al beb. Esta prueba se denomina amniocentesis. El lquido amnitico se obtiene introduciendo una aguja en el vientre (abdomen). Se realiza para controlar los cromosomas en aquellos casos en los que existe alguna preocupacin acerca de algn problema gentico que pueda sufrir el beb. En ocasiones se lleva a cabo cerca del final del embarazo, si es necesario inducir al parto. En este caso se realiza para asegurarse que los pulmones del beb estn lo suficientemente maduros como para que pueda vivir fuera del tero. CAMBIOS QUE OCURREN EN EL SEGUNDO TRIMESTRE DEL EMBARAZO Su organismo atravesar numerosos cambios durante el embarazo. Estos pueden variar de una persona a otra. Converse con el profesional que la asiste acerca los cambios que usted note y que la preocupen.  Durante el segundo trimestre probablemente sienta un aumento del apetito. Es normal tener "antojos" de ciertas comidas. Esto vara de una persona a otra y de un embarazo a otro.  El abdomen inferior comenzar a abultarse.  Podr tener la necesidad de orinar con ms frecuencia debido a   que el tero y el beb presionan sobre la vejiga. Tambin es frecuente contraer ms infecciones urinarias durante el embarazo. Puede evitarlas bebiendo gran cantidad de lquidos y vaciando la vejiga antes y  despus de mantener relaciones sexuales.  Podrn aparecer las primeras estras en las caderas, abdomen y mamas. Estos son cambios normales del cuerpo durante el embarazo. No existen medicamentos ni ejercicios que puedan prevenir estos cambios.  Es posible que comience a desarrollar venas inflamadas y abultadas (varices) en las piernas. El uso de medias de descanso, elevar sus pies durante 15 minutos, 3 a 4 veces al da y limitar la sal en su dieta ayuda a aliviar el problema.  Podr sentir acidez gstrica a medida que el tero crece y presiona contra el estmago. Puede tomar anticidos, con la autorizacin de su mdico, para aliviar este problema. Tambin es til ingerir pequeas comidas 4 a 5 veces al da.  La constipacin puede tratarse con un laxante o agregando fibra a su dieta. Beber grandes cantidades de lquidos, comer vegetales, frutas y granos integrales es de gran ayuda.  Tambin es beneficioso practicar actividad fsica. Si ha sido una persona activa hasta el embarazo, podr continuar con la mayora de las actividades durante el mismo. Si ha sido menos activa, puede ser beneficioso que comience con un programa de ejercicios, como realizar caminatas.  Puede desarrollar hemorroides hacia el final del segundo trimestre. Tomar baos de asiento tibios y utilizar cremas recomendadas por el profesional que lo asiste sern de ayuda para los problemas de hemorroides.  Tambin podr sentir dolor de espalda durante este momento de su embarazo. Evite levantar objetos pesados, utilice zapatos de taco bajo y mantenga una buena postura para ayudar a reducir los problemas de espalda.  Algunas mujeres embarazadas desarrollan hormigueo y adormecimiento de la mano y los dedos debido a la hinchazn y compresin de los ligamentos de la mueca (sndrome del tnel carpiano). Esto desaparece una vez que el beb nace.  Como sus pechos se agrandan, necesitar un sujetador ms grande. Use un sostn de soporte,  cmodo y de algodn. No utilice un sostn para amamantar hasta el ltimo mes de embarazo si va a amamantar al beb.  Podr observar una lnea oscura desde el ombligo hacia la zona pbica denominada linea nigra.  Podr observar que sus mejillas se ponen coloradas debido al aumento de flujo sanguneo en la cara.  Podr desarrollar "araitas" en la cara, cuello y pecho. Esto desaparece una vez que el beb nace. INSTRUCCIONES PARA EL CUIDADO DOMICILIARIO  Es extremadamente importante que evite el cigarrillo, hierbas medicinales, alcohol y las drogas no prescriptas durante el embarazo. Estas sustancias qumicas afectan la formacin y el desarrollo del beb. Evite estas sustancias durante todo el embarazo para asegurar el nacimiento de un beb sano.  La mayor parte de los cuidados que se aconsejan son los mismos que los indicados para el primer trimestre del embarazo. Cumpla con las citas tal como se le indic. Siga las instrucciones del profesional que lo asiste con respecto al uso de los medicamentos, el ejercicio y la dieta.  Durante el embarazo debe obtener nutrientes para usted y para su beb. Consuma alimentos balanceados a intervalos regulares. Elija alimentos como carne, pescado, leche y otros productos lcteos descremados, vegetales, frutas, panes integrales y cereales. El profesional le informar cul es el aumento de peso ideal.  Las relaciones sexuales fsicas pueden continuarse hasta cerca del fin del embarazo si no existen otros problemas. Estos problemas pueden ser la prdida temprana (  prematura) de lquido amnitico de las membranas, sangrado vaginal, dolor abdominal u otros problemas mdicos o del embarazo.  Realice actividad fsica todos los das, si no tiene restricciones. Consulte con el profesional que la asiste si no sabe con certeza si determinados ejercicios son seguros. El mayor aumento de peso tiene lugar durante los ltimos 2 trimestres del embarazo. El ejercicio la ayudar  a:  Controlar su peso.  Ponerla en forma para el parto.  Ayudarla a perder peso luego de haber dado a luz.  Use un buen sostn o como los que se usan para hacer deportes para aliviar la sensibilidad de las mamas. Tambin puede serle til si lo usa mientras duerme. Si pierde calostro, podr utilizar apsitos en el sostn.  No utilice la baera con agua caliente, baos turcos y saunas durante el embarazo.  Utilice el cinturn de seguridad sin excepcin cuando conduzca. Este la proteger a usted y al beb en caso de accidente.  Evite comer carne cruda, queso crudo, y el contacto con los utensilios y desperdicios de los gatos. Estos elementos contienen grmenes que pueden causar defectos de nacimiento en el beb.  El segundo trimestre es un buen momento para visitar a su dentista y evaluar su salud dental si an no lo ha hecho. Es importante mantener los dientes limpios. Utilice un cepillo de dientes blando. Cepllese ms suavemente durante el embarazo.  Es ms fcil perder algo de orina durante el embarazo. Apretar y fortalecer los msculos de la pelvis la ayudar con este problema. Practique detener la miccin cuando est en el bao. Estos son los mismos msculos que necesita fortalecer. Son tambin los mismos msculos que utiliza cuando trata de evitar los gases. Puede practicar apretando estos msculos 10 veces, y repetir esto 3 veces por da aproximadamente. Una vez que conozca qu msculos debe apretar, no realice estos ejercicios durante la miccin. Puede favorecerle una infeccin si la orina vuelve hacia atrs.  Pida ayuda si tiene necesidades econmicas, de asesoramiento o nutricionales durante el embarazo. El profesional podr ayudarla con respecto a estas necesidades, o derivarla a otros especialistas.  La piel puede ponerse grasa. Si esto sucede, lvese la cara con un jabn suave, utilice un humectante no graso y maquillaje con base de aceite o crema. CONSUMO DE MEDICAMENTOS Y DROGAS  DURANTE EL EMBARAZO  Contine tomando las vitaminas apropiadas para esta etapa tal como se le indic. Las vitaminas deben contener un miligramo de cido flico y deben suplementarse con hierro. Guarde todas las vitaminas fuera del alcance de los nios. La ingestin de slo un par de vitaminas o tabletas que contengan hierro puede ocasionar la muerte en un beb o en un nio pequeo.  Evite el uso de medicamentos, inclusive los de venta libre y hierbas que no hayan sido prescriptos o indicados por el profesional que la asiste. Algunos medicamentos pueden causar problemas fsicos al beb. Utilice los medicamentos de venta libre o de prescripcin para el dolor, el malestar o la fiebre, segn se lo indique el profesional que lo asiste. No utilice aspirina.  El consumo de alcohol est relacionado con ciertos defectos de nacimiento. Esto incluye el sndrome de alcoholismo fetal. Debe evitar el consumo de alcohol en cualquiera de sus formas. El cigarrillo causa nacimientos prematuros y bebs de bajo peso. El uso de drogas recreativas est absolutamente prohibido. Son muy nocivas para el beb. Un beb que nace de una madre adicta, ser adicto al nacer. Ese beb tendr los mismos sntomas de abstinencia que un adulto.    Infrmele al profesional si consume alguna droga.  No consuma drogas ilegales. Pueden causarle mucho dao al beb. SOLICITE ATENCIN MDICA SI: Tiene preguntas o preocupaciones durante su embarazo. Es mejor que llame para consultar las dudas que esperar hasta su prxima visita prenatal. De esta forma se sentir ms tranquila.  SOLICITE ATENCIN MDICA DE INMEDIATO SI:  La temperatura oral se eleva sin motivo por encima de 102 F (38.9 C) o segn le indique el profesional que lo asiste.  Tiene una prdida de lquido por la vagina (canal de parto). Si sospecha una ruptura de las membranas, tmese la temperatura y llame al profesional para informarlo sobre esto.  Observa unas pequeas manchas,  una hemorragia vaginal o elimina cogulos. Notifique al profesional acerca de la cantidad y de cuntos apsitos est utilizando. Unas pequeas manchas de sangre son algo comn durante el embarazo, especialmente despus de mantener relaciones sexuales.  Presenta un olor desagradable en la secrecin vaginal y observa un cambio en el color, de transparente a blanco.  Contina con las nuseas y no obtiene alivio de los remedios indicados. Vomita sangre o algo similar a la borra del caf.  [place] o sube ms de 900 g. en una semana, o segn lo indicado por el profesional que la asiste.  Observa que se le hinchan el rostro, las manos, los pies o las piernas.  Ha estado expuesta a la rubola y no ha sufrido la enfermedad.  Ha estado expuesta a la quinta enfermedad o a la varicela.  Presenta dolor abdominal. Las molestias en el ligamento redondo son una causa no cancerosa (benigna) frecuente de dolor abdominal durante el embarazo. El profesional que la asiste deber evaluarla.  Presenta dolor de cabeza intenso que no se alivia.  Presenta fiebre, diarrea, dolor al orinar o le falta la respiracin.  Presenta dificultad para ver, visin borrosa, o visin doble.  Sufre una cada, un accidente de trnsito o cualquier tipo de trauma.  Vive en un hogar en el que existe violencia fsica o mental. Document Released: 10/20/2004 Document Revised: 10/05/2011 [employer] Patient Information 2014 [employer].  

## 2012-12-11 ENCOUNTER — Ambulatory Visit (HOSPITAL_COMMUNITY)
Admission: RE | Admit: 2012-12-11 | Discharge: 2012-12-11 | Disposition: A | Discharge status: Home / Self Care | Payer: Self-pay | Source: Ambulatory Visit | Attending: Family Medicine | Admitting: Family Medicine

## 2012-12-11 ENCOUNTER — Encounter: Payer: Self-pay | Admitting: Family Medicine

## 2012-12-11 ENCOUNTER — Ambulatory Visit (HOSPITAL_COMMUNITY): Admission: RE | Admit: 2012-12-11 | Payer: Self-pay | Source: Ambulatory Visit

## 2012-12-11 DIAGNOSIS — Z3689 Encounter for other specified antenatal screening: Secondary | ICD-10-CM | POA: Insufficient documentation

## 2012-12-11 DIAGNOSIS — O34219 Maternal care for unspecified type scar from previous cesarean delivery: Secondary | ICD-10-CM | POA: Insufficient documentation

## 2012-12-11 DIAGNOSIS — Z3483 Encounter for supervision of other normal pregnancy, third trimester: Secondary | ICD-10-CM

## 2012-12-18 ENCOUNTER — Encounter: Payer: Self-pay | Admitting: Advanced Practice Midwife

## 2012-12-18 ENCOUNTER — Ambulatory Visit (INDEPENDENT_AMBULATORY_CARE_PROVIDER_SITE_OTHER): Payer: Self-pay | Admitting: Advanced Practice Midwife

## 2012-12-18 VITALS — BP 106/67 | Temp 98.1°F | Wt 132.0 lb

## 2012-12-18 DIAGNOSIS — Z3480 Encounter for supervision of other normal pregnancy, unspecified trimester: Secondary | ICD-10-CM

## 2012-12-18 DIAGNOSIS — Z3482 Encounter for supervision of other normal pregnancy, second trimester: Secondary | ICD-10-CM

## 2012-12-18 LAB — POCT URINALYSIS DIP (DEVICE)
Ketones, ur: NEGATIVE mg/dL
Nitrite: NEGATIVE
Protein, ur: NEGATIVE mg/dL
Specific Gravity, Urine: 1.02 (ref 1.005–1.030)
pH: 7.5 (ref 5.0–8.0)

## 2012-12-18 NOTE — Progress Notes (Signed)
Pulse- [phone] 

## 2012-12-18 NOTE — Patient Instructions (Signed)
Contraception Choices Contraception (birth control) is the use of any methods or devices to prevent pregnancy. Below are some methods to help avoid pregnancy. HORMONAL METHODS   Contraceptive implant This is a thin, plastic tube containing progesterone hormone. It does not contain estrogen hormone. Your health care provider inserts the tube in the inner part of the upper arm. The tube can remain in place for up to 3 years. After 3 years, the implant must be removed. The implant prevents the ovaries from releasing an egg (ovulation), thickens the cervical mucus to prevent sperm from entering the uterus, and thins the lining of the inside of the uterus.  Progesterone-only injections These injections are given every 3 months by your health care provider to prevent pregnancy. This synthetic progesterone hormone stops the ovaries from releasing eggs. It also thickens cervical mucus and changes the uterine lining. This makes it harder for sperm to survive in the uterus.  Birth control pills These pills contain estrogen and progesterone hormone. They work by preventing the ovaries from releasing eggs (ovulation). They also cause the cervical mucus to thicken, preventing the sperm from entering the uterus. Birth control pills are prescribed by a health care provider.Birth control pills can also be used to treat heavy periods.  Minipill This type of birth control pill contains only the progesterone hormone. They are taken every day of each month and must be prescribed by your health care provider.  Birth control patch The patch contains hormones similar to those in birth control pills. It must be changed once a week and is prescribed by a health care provider.  Vaginal ring The ring contains hormones similar to those in birth control pills. It is left in the vagina for 3 weeks, removed for 1 week, and then a new one is put back in place. The patient must be comfortable inserting and removing the ring from the  vagina.A health care provider's prescription is necessary.  Emergency contraception Emergency contraceptives prevent pregnancy after unprotected sexual intercourse. This pill can be taken right after sex or up to 5 days after unprotected sex. It is most effective the sooner you take the pills after having sexual intercourse. Most emergency contraceptive pills are available without a prescription. Check with your pharmacist. Do not use emergency contraception as your only form of birth control. BARRIER METHODS   Female condom This is a thin sheath (latex or rubber) that is worn over the penis during sexual intercourse. It can be used with spermicide to increase effectiveness.  Female condom. This is a soft, loose-fitting sheath that is put into the vagina before sexual intercourse.  Diaphragm This is a soft, latex, dome-shaped barrier that must be fitted by a health care provider. It is inserted into the vagina, along with a spermicidal jelly. It is inserted before intercourse. The diaphragm should be left in the vagina for 6 to 8 hours after intercourse.  Cervical cap This is a round, soft, latex or plastic cup that fits over the cervix and must be fitted by a health care provider. The cap can be left in place for up to 48 hours after intercourse.  Sponge This is a soft, circular piece of polyurethane foam. The sponge has spermicide in it. It is inserted into the vagina after wetting it and before sexual intercourse.  Spermicides These are chemicals that kill or block sperm from entering the cervix and uterus. They come in the form of creams, jellies, suppositories, foam, or tablets. They do not require a   prescription. They are inserted into the vagina with an applicator before having sexual intercourse. The process must be repeated every time you have sexual intercourse. INTRAUTERINE CONTRACEPTION  Intrauterine device (IUD) This is a T-shaped device that is put in a woman's uterus during a  menstrual period to prevent pregnancy. There are 2 types:  Copper IUD This type of IUD is wrapped in copper wire and is placed inside the uterus. Copper makes the uterus and fallopian tubes produce a fluid that kills sperm. It can stay in place for 10 years.  Hormone IUD This type of IUD contains the hormone progestin (synthetic progesterone). The hormone thickens the cervical mucus and prevents sperm from entering the uterus, and it also thins the uterine lining to prevent implantation of a fertilized egg. The hormone can weaken or kill the sperm that get into the uterus. It can stay in place for 3 5 years, depending on which type of IUD is used. PERMANENT METHODS OF CONTRACEPTION  Female tubal ligation This is when the woman's fallopian tubes are surgically sealed, tied, or blocked to prevent the egg from traveling to the uterus.  Hysteroscopic sterilization This involves placing a small coil or insert into each fallopian tube. Your doctor uses a technique called hysteroscopy to do the procedure. The device causes scar tissue to form. This results in permanent blockage of the fallopian tubes, so the sperm cannot fertilize the egg. It takes about 3 months after the procedure for the tubes to become blocked. You must use another form of birth control for these 3 months.  Female sterilization This is when the female has the tubes that carry sperm tied off (vasectomy).This blocks sperm from entering the vagina during sexual intercourse. After the procedure, the man can still ejaculate fluid (semen). NATURAL PLANNING METHODS  Natural family planning This is not having sexual intercourse or using a barrier method (condom, diaphragm, cervical cap) on days the woman could become pregnant.  Calendar method This is keeping track of the length of each menstrual cycle and identifying when you are fertile.  Ovulation method This is avoiding sexual intercourse during ovulation.  Symptothermal method This is  avoiding sexual intercourse during ovulation, using a thermometer and ovulation symptoms.  Post ovulation method This is timing sexual intercourse after you have ovulated. Regardless of which type or method of contraception you choose, it is important that you use condoms to protect against the transmission of sexually transmitted infections (STIs). Talk with your health care provider about which form of contraception is most appropriate for you. Document Released: 01/10/2005 Document Revised: 09/12/2012 Document Reviewed: 07/05/2012 [employer] Patient Information 2014 [employer].  

## 2012-12-18 NOTE — Progress Notes (Signed)
Feels well. Undecided re: contraception. Has no questions about types. Encouraged to consider Peds providers. Declines genetic screening.

## 2013-01-15 ENCOUNTER — Encounter: Payer: Self-pay | Admitting: Obstetrics & Gynecology

## 2013-01-15 ENCOUNTER — Ambulatory Visit (INDEPENDENT_AMBULATORY_CARE_PROVIDER_SITE_OTHER): Payer: Self-pay | Admitting: Obstetrics & Gynecology

## 2013-01-15 VITALS — BP 113/64 | Temp 97.9°F | Wt 137.5 lb

## 2013-01-15 DIAGNOSIS — L708 Other acne: Secondary | ICD-10-CM

## 2013-01-15 DIAGNOSIS — O34219 Maternal care for unspecified type scar from previous cesarean delivery: Secondary | ICD-10-CM

## 2013-01-15 DIAGNOSIS — Z3482 Encounter for supervision of other normal pregnancy, second trimester: Secondary | ICD-10-CM

## 2013-01-15 DIAGNOSIS — L700 Acne vulgaris: Secondary | ICD-10-CM

## 2013-01-15 LAB — POCT URINALYSIS DIP (DEVICE)
Bilirubin Urine: NEGATIVE
Glucose, UA: NEGATIVE mg/dL
Hgb urine dipstick: NEGATIVE
Ketones, ur: NEGATIVE mg/dL
Leukocytes, UA: NEGATIVE
Nitrite: NEGATIVE
Protein, ur: NEGATIVE mg/dL
Specific Gravity, Urine: 1.02 (ref 1.005–1.030)
Urobilinogen, UA: 0.2 mg/dL (ref 0.0–1.0)
pH: 7 (ref 5.0–8.0)

## 2013-01-15 MED ORDER — CLINDAMYCIN PHOSPHATE 1 % EX LOTN: TOPICAL_LOTION | Freq: Two times a day (BID) | CUTANEOUS | Status: DC

## 2013-01-15 NOTE — Progress Notes (Signed)
Pulse- [phone] 

## 2013-01-15 NOTE — Progress Notes (Signed)
Cystic facial acne, previously responded to treatment.

## 2013-01-15 NOTE — Patient Instructions (Signed)
Acn  (Acne)   El acn es un problema de la piel que causa granos. Aparece cuando los poros de la piel se obstruyen. Los poros pueden enrojecer, hincharse y doler (inflamarse) o infectarse con una bacteria que habitualmente se encuentra en la piel (Propionibacterium acnes). El acn es un trastorno comn de la piel. El 80% de las personas sufre acn en algn momento. Es especialmente frecuente entre los 12 y los 24 aos. Generalmente desaparece despus de un tiempo con el tratamiento adecuado.  CAUSAS  Cada uno de los poros contiene una glndula sebcea. Esta glndula sebcea produce una sustancia grasosa llamada sebo. El acn aparece cuando estas glndulas se obstruyen con sebo, clulas de la piel muertas y suciedad. Entonces las bacterias P. acnes que normalmente se encuentran en las glndulas sebceas se multiplican y causan inflamacin. Con frecuencia el acn es originado por cambios hormonales. Estos cambios hacen que las glndulas sebceas se agranden y produzcan ms sebo. Los factores que empeoran el acn son:    Cambios hormonales durante la adolescencia.   Cambios hormonales en los ciclos menstruales femeninos.   Cambios hormonales durante el embarazo.   Cosmticos y productos para el cabello con base de aceites.   Restregarse vigorosamente la piel.   Jabones fuertes.   Estrs.   Problemas hormonales debidos a ciertas enfermedades.   Cabello largo o grasoso que toca la piel.   Algunos medicamentos.   Presin por vinchas, mochilas u hombreras.   Exposicin a ciertos aceites y sustancias qumicas.  SNTOMAS  El acn aparece con ms frecuencia en el rostro, el cuello, el pecho y la parte superior de la espalda. Los sntomas son:    Bultos pequeos, rojos(granos o ppulas).   Barritos (comedones cerrados).   Espinillas (comedones abiertos).   Granos pequeos llenos de pus (pstulas).   Granos o pstulas grandes y rojas que duelen.  El acn ms grave puede causar:    Infeccin en una zona en la  que se acumula pus (absceso).   Sacos duros, dolorosos y llenos de lquido (quistes).   Cicatrices.  DIAGNSTICO   El mdico puede diagnosticar el problema haciendo un examen fsico.   TRATAMIENTO  Existen muchos tratamientos buenos para el acn. Algunos estn disponibles como medicamentos de venta libre y otros son recetados. El mejor tratamiento para usted depende del tipo de acn que presente y de su gravedad. Puede llevar 2 meses de tratamiento antes de que el acn comience a mejorar. Los tratamientos ms comunes son:    Cremas y lociones que evitan que las glndulas sebceas se obstruyan.   Cremas y lociones para tratar o prevenir infecciones e inflamacin.   Antibiticos-sobre la piel o por va oral.   Comprimidos que disminuyan la produccin de cebo.   Anticonceptivos.   Luces especiales o rayo lser.   Ciruga menor.   Medicamentos inyectables en las zonas de acn.   Sustancias qumicas que produzcan un peeling en la piel.  INSTRUCCIONES PARA EL CUIDADO DOMICILIARIO  Un buen cuidado de la piel es lo ms importante del tratamiento.    Lave la piel suavemente al menos dos veces por da y luego de practicar actividad fsica. Limpie su piel siempre antes de irse a dormir.   Utilice un jabn suave.   Despus de lavarse, aplique una crema humectante a base de agua.   Mantenga el cabello limpio y fuera del rostro. Lvelo todos los das con champ.   Tome slo la medicacin que le indic el profesional.     Use pantalla solar con SPF 30  ms. Esto es muy importante si toma medicamentos para curar el acn.   Elija cosmticos no comedognicos. Esto significa que no obstruyan las glndulas sebceas.   Evite apoyar la barbilla o la frente en las manos.   Evite el uso de vinchas o sombreros apretados.   Evite rascarse o apretar los granos. Esto puede hacer que el acn empeore y cause cicatrices.  SOLICITE ANTENCIN MDICA SI:   El acn no mejora en 8 semanas.   El acn empeora.   Observa una zona  mayor de piel est roja o duele.  Document Released: 01/10/2005 Document Revised: 04/04/2011  [employer] Patient Information 2014 [employer].

## 2013-02-12 ENCOUNTER — Ambulatory Visit (INDEPENDENT_AMBULATORY_CARE_PROVIDER_SITE_OTHER): Payer: Self-pay | Admitting: Obstetrics and Gynecology

## 2013-02-12 VITALS — BP 117/74 | Temp 101.6°F | Wt 141.0 lb

## 2013-02-12 DIAGNOSIS — Z3480 Encounter for supervision of other normal pregnancy, unspecified trimester: Secondary | ICD-10-CM

## 2013-02-12 DIAGNOSIS — O34219 Maternal care for unspecified type scar from previous cesarean delivery: Secondary | ICD-10-CM

## 2013-02-12 LAB — POCT URINALYSIS DIP (DEVICE)
BILIRUBIN URINE: NEGATIVE
GLUCOSE, UA: NEGATIVE mg/dL
Hgb urine dipstick: NEGATIVE
KETONES UR: NEGATIVE mg/dL
LEUKOCYTES UA: NEGATIVE
Nitrite: NEGATIVE
Protein, ur: NEGATIVE mg/dL
Specific Gravity, Urine: 1.015 (ref 1.005–1.030)
Urobilinogen, UA: 0.2 mg/dL (ref 0.0–1.0)
pH: 7.5 (ref 5.0–8.0)

## 2013-02-12 NOTE — Progress Notes (Signed)
Evaluation and management procedures were performed by [facility] under my supervision/collaboration. Chart reviewed, patient examined by me and I agree with management and plan. 

## 2013-02-12 NOTE — Patient Instructions (Signed)
Second Trimester of Pregnancy The second trimester is from week 13 through week 28, months 4 through 6. The second trimester is often a time when you feel your best. Your body has also adjusted to being pregnant, and you begin to feel better physically. Usually, morning sickness has lessened or quit completely, you may have more energy, and you may have an increase in appetite. The second trimester is also a time when the fetus is growing rapidly. At the end of the sixth month, the fetus is about 9 inches long and weighs about 1 pounds. You will likely begin to feel the baby move (quickening) between 18 and 20 weeks of the pregnancy. BODY CHANGES Your body goes through many changes during pregnancy. The changes vary from woman to woman.   Your weight will continue to increase. You will notice your lower abdomen bulging out.  You may begin to get stretch marks on your hips, abdomen, and breasts.  You may develop headaches that can be relieved by medicines approved by your caregiver.  You may urinate more often because the fetus is pressing on your bladder.  You may develop or continue to have heartburn as a result of your pregnancy.  You may develop constipation because certain hormones are causing the muscles that push waste through your intestines to slow down.  You may develop hemorrhoids or swollen, bulging veins (varicose veins).  You may have back pain because of the weight gain and pregnancy hormones relaxing your joints between the bones in your pelvis and as a result of a shift in weight and the muscles that support your balance.  Your breasts will continue to grow and be tender.  Your gums may bleed and may be sensitive to brushing and flossing.  Dark spots or blotches (chloasma, mask of pregnancy) may develop on your face. This will likely fade after the baby is born.  A dark line from your belly button to the pubic area (linea nigra) may appear. This will likely fade after the  baby is born. WHAT TO EXPECT AT YOUR PRENATAL VISITS During a routine prenatal visit:  You will be weighed to make sure you and the fetus are growing normally.  Your blood pressure will be taken.  Your abdomen will be measured to track your baby's growth.  The fetal heartbeat will be listened to.  Any test results from the previous visit will be discussed. Your caregiver may ask you:  How you are feeling.  If you are feeling the baby move.  If you have had any abnormal symptoms, such as leaking fluid, bleeding, severe headaches, or abdominal cramping.  If you have any questions. Other tests that may be performed during your second trimester include:  Blood tests that check for:  Low iron levels (anemia).  Gestational diabetes (between 24 and 28 weeks).  Rh antibodies.  Urine tests to check for infections, diabetes, or protein in the urine.  An ultrasound to confirm the proper growth and development of the baby.  An amniocentesis to check for possible genetic problems.  Fetal screens for spina bifida and Down syndrome. HOME CARE INSTRUCTIONS   Avoid all smoking, herbs, alcohol, and unprescribed drugs. These chemicals affect the formation and growth of the baby.  Follow your caregiver's instructions regarding medicine use. There are medicines that are either safe or unsafe to take during pregnancy.  Exercise only as directed by your caregiver. Experiencing uterine cramps is a good sign to stop exercising.  Continue to eat regular,   healthy meals.  Wear a good support bra for breast tenderness.  Do not use hot tubs, steam rooms, or saunas.  Wear your seat belt at all times when driving.  Avoid raw meat, uncooked cheese, cat litter boxes, and soil used by cats. These carry germs that can cause birth defects in the baby.  Take your prenatal vitamins.  Try taking a stool softener (if your caregiver approves) if you develop constipation. Eat more high-fiber foods,  such as fresh vegetables or fruit and whole grains. Drink plenty of fluids to keep your urine clear or pale yellow.  Take warm sitz baths to soothe any pain or discomfort caused by hemorrhoids. Use hemorrhoid cream if your caregiver approves.  If you develop varicose veins, wear support hose. Elevate your feet for 15 minutes, 3 4 times a day. Limit salt in your diet.  Avoid heavy lifting, wear low heel shoes, and practice good posture.  Rest with your legs elevated if you have leg cramps or low back pain.  Visit your dentist if you have not gone yet during your pregnancy. Use a soft toothbrush to brush your teeth and be gentle when you floss.  A sexual relationship may be continued unless your caregiver directs you otherwise.  Continue to go to all your prenatal visits as directed by your caregiver. SEEK MEDICAL CARE IF:   You have dizziness.  You have mild pelvic cramps, pelvic pressure, or nagging pain in the abdominal area.  You have persistent nausea, vomiting, or diarrhea.  You have a bad smelling vaginal discharge.  You have pain with urination. SEEK IMMEDIATE MEDICAL CARE IF:   You have a fever.  You are leaking fluid from your vagina.  You have spotting or bleeding from your vagina.  You have severe abdominal cramping or pain.  You have rapid weight gain or loss.  You have shortness of breath with chest pain.  You notice sudden or extreme swelling of your face, hands, ankles, feet, or legs.  You have not felt your baby move in over an hour.  You have severe headaches that do not go away with medicine.  You have vision changes. Document Released: 01/04/2001 Document Revised: 09/12/2012 Document Reviewed: 03/13/2012 [employer] Patient Information 2014 [employer].  

## 2013-02-12 NOTE — Progress Notes (Signed)
P= 100 C/o of cold today-- temperature, sore throat, cough, and runny nose.  C/o of intermittent lower abdominal/pelvic pressure.

## 2013-02-12 NOTE — Progress Notes (Signed)
Patient complains of cold symptoms today (cough, nasal drainage, headaches, itchy/burning throat with phlegm).  On inspection, throat is red and irritated but without s/s of strep. Discussed medications appropriate in pregnancy and gave pt a written copy.  Also reports pelvic pain in area of symphysis pubis.  Tender to touch. Denies contractions, VB, LOF.  Glucose test deferred until next visit.

## 2013-02-26 ENCOUNTER — Ambulatory Visit (INDEPENDENT_AMBULATORY_CARE_PROVIDER_SITE_OTHER): Payer: Self-pay | Admitting: Family Medicine

## 2013-02-26 VITALS — BP 105/66 | Temp 98.1°F | Wt 138.4 lb

## 2013-02-26 DIAGNOSIS — O34219 Maternal care for unspecified type scar from previous cesarean delivery: Secondary | ICD-10-CM

## 2013-02-26 DIAGNOSIS — Z3480 Encounter for supervision of other normal pregnancy, unspecified trimester: Secondary | ICD-10-CM

## 2013-02-26 LAB — POCT URINALYSIS DIP (DEVICE)
Bilirubin Urine: NEGATIVE
GLUCOSE, UA: NEGATIVE mg/dL
HGB URINE DIPSTICK: NEGATIVE
Leukocytes, UA: NEGATIVE
Nitrite: NEGATIVE
Protein, ur: 30 mg/dL — AB
Specific Gravity, Urine: 1.02 (ref 1.005–1.030)
UROBILINOGEN UA: 1 mg/dL (ref 0.0–1.0)
pH: 7 (ref 5.0–8.0)

## 2013-02-26 LAB — CBC
HCT: 31.3 % — ABNORMAL LOW (ref 36.0–46.0)
Hemoglobin: 10.2 g/dL — ABNORMAL LOW (ref 12.0–15.0)
MCH: 27 pg (ref 26.0–34.0)
MCHC: 32.6 g/dL (ref 30.0–36.0)
MCV: 82.8 fL (ref 78.0–100.0)
Platelets: 429 10*3/uL — ABNORMAL HIGH (ref 150–400)
RBC: 3.78 MIL/uL — ABNORMAL LOW (ref 3.87–5.11)
RDW: 13.4 % (ref 11.5–15.5)
WBC: 6.8 10*3/uL (ref 4.0–10.5)

## 2013-02-26 NOTE — Progress Notes (Signed)
S: 28 yo [id number] @ [id number] here for ROBV - no ctx, lof, vb. +FM  O: see flowsheet  A/p - 28 week labs today - previous c/[place] in [place] with vertical skin but term pregnancy and likely lower uterine segment incision making TOLAC possible - TOLAC consent obtained - f/u in 2 weeks

## 2013-02-26 NOTE — Progress Notes (Signed)
P= 100 Requests PNV refill.  C/o of intermittent lower abdominal/pelvic pressure.  1hr gtt and labs today. Tdap discussed; information sheet given and pt. To decided at next visit.  TOLAC consent signed today.

## 2013-02-26 NOTE — Patient Instructions (Signed)
Third Trimester of Pregnancy  The third trimester is from week 29 through week 42, months 7 through 9. The third trimester is a time when the fetus is growing rapidly. At the end of the ninth month, the fetus is about 20 inches in length and weighs 6 10 pounds.   BODY CHANGES  Your body goes through many changes during pregnancy. The changes vary from woman to woman.    Your weight will continue to increase. You can expect to gain 25 35 pounds (11 16 kg) by the end of the pregnancy.   You may begin to get stretch marks on your hips, abdomen, and breasts.   You may urinate more often because the fetus is moving lower into your pelvis and pressing on your bladder.   You may develop or continue to have heartburn as a result of your pregnancy.   You may develop constipation because certain hormones are causing the muscles that push waste through your intestines to slow down.   You may develop hemorrhoids or swollen, bulging veins (varicose veins).   You may have pelvic pain because of the weight gain and pregnancy hormones relaxing your joints between the bones in your pelvis. Back aches may result from over exertion of the muscles supporting your posture.   Your breasts will continue to grow and be tender. A yellow discharge may leak from your breasts called colostrum.   Your belly button may stick out.   You may feel short of breath because of your expanding uterus.   You may notice the fetus "dropping," or moving lower in your abdomen.   You may have a bloody mucus discharge. This usually occurs a few days to a week before labor begins.   Your cervix becomes thin and soft (effaced) near your due date.  WHAT TO EXPECT AT YOUR PRENATAL EXAMS   You will have prenatal exams every 2 weeks until week 36. Then, you will have weekly prenatal exams. During a routine prenatal visit:   You will be weighed to make sure you and the fetus are growing normally.   Your blood pressure is taken.   Your abdomen will be  measured to track your baby's growth.   The fetal heartbeat will be listened to.   Any test results from the previous visit will be discussed.   You may have a cervical check near your due date to see if you have effaced.  At around 36 weeks, your caregiver will check your cervix. At the same time, your caregiver will also perform a test on the secretions of the vaginal tissue. This test is to determine if a type of bacteria, Group B streptococcus, is present. Your caregiver will explain this further.  Your caregiver may ask you:   What your birth plan is.   How you are feeling.   If you are feeling the baby move.   If you have had any abnormal symptoms, such as leaking fluid, bleeding, severe headaches, or abdominal cramping.   If you have any questions.  Other tests or screenings that may be performed during your third trimester include:   Blood tests that check for low iron levels (anemia).   Fetal testing to check the health, activity level, and growth of the fetus. Testing is done if you have certain medical conditions or if there are problems during the pregnancy.  FALSE LABOR  You may feel small, irregular contractions that eventually go away. These are called Braxton Hicks contractions, or   false labor. Contractions may last for hours, days, or even weeks before true labor sets in. If contractions come at regular intervals, intensify, or become painful, it is best to be seen by your caregiver.   SIGNS OF LABOR    Menstrual-like cramps.   Contractions that are 5 minutes apart or less.   Contractions that start on the top of the uterus and spread down to the lower abdomen and back.   A sense of increased pelvic pressure or back pain.   A watery or bloody mucus discharge that comes from the vagina.  If you have any of these signs before the 37th week of pregnancy, call your caregiver right away. You need to go to the hospital to get checked immediately.  HOME CARE INSTRUCTIONS    Avoid all  smoking, herbs, alcohol, and unprescribed drugs. These chemicals affect the formation and growth of the baby.   Follow your caregiver's instructions regarding medicine use. There are medicines that are either safe or unsafe to take during pregnancy.   Exercise only as directed by your caregiver. Experiencing uterine cramps is a good sign to stop exercising.   Continue to eat regular, healthy meals.   Wear a good support bra for breast tenderness.   Do not use hot tubs, steam rooms, or saunas.   Wear your seat belt at all times when driving.   Avoid raw meat, uncooked cheese, cat litter boxes, and soil used by cats. These carry germs that can cause birth defects in the baby.   Take your prenatal vitamins.   Try taking a stool softener (if your caregiver approves) if you develop constipation. Eat more high-fiber foods, such as fresh vegetables or fruit and whole grains. Drink plenty of fluids to keep your urine clear or pale yellow.   Take warm sitz baths to soothe any pain or discomfort caused by hemorrhoids. Use hemorrhoid cream if your caregiver approves.   If you develop varicose veins, wear support hose. Elevate your feet for 15 minutes, 3 4 times a day. Limit salt in your diet.   Avoid heavy lifting, wear low heal shoes, and practice good posture.   Rest a lot with your legs elevated if you have leg cramps or low back pain.   Visit your dentist if you have not gone during your pregnancy. Use a soft toothbrush to brush your teeth and be gentle when you floss.   A sexual relationship may be continued unless your caregiver directs you otherwise.   Do not travel far distances unless it is absolutely necessary and only with the approval of your caregiver.   Take prenatal classes to understand, practice, and ask questions about the labor and delivery.   Make a trial run to the hospital.   Pack your hospital bag.   Prepare the baby's nursery.   Continue to go to all your prenatal visits as directed  by your caregiver.  SEEK MEDICAL CARE IF:   You are unsure if you are in labor or if your water has broken.   You have dizziness.   You have mild pelvic cramps, pelvic pressure, or nagging pain in your abdominal area.   You have persistent nausea, vomiting, or diarrhea.   You have a bad smelling vaginal discharge.   You have pain with urination.  SEEK IMMEDIATE MEDICAL CARE IF:    You have a fever.   You are leaking fluid from your vagina.   You have spotting or bleeding from your vagina.     You have severe abdominal cramping or pain.   You have rapid weight loss or gain.   You have shortness of breath with chest pain.   You notice sudden or extreme swelling of your face, hands, ankles, feet, or legs.   You have not felt your baby move in over an hour.   You have severe headaches that do not go away with medicine.   You have vision changes.  Document Released: 01/04/2001 Document Revised: 09/12/2012 Document Reviewed: 03/13/2012  [employer] Patient Information 2014 [employer].

## 2013-02-27 ENCOUNTER — Encounter: Payer: Self-pay | Admitting: Family Medicine

## 2013-02-27 LAB — GLUCOSE TOLERANCE, 1 HOUR (50G) W/O FASTING: GLUCOSE 1 HOUR GTT: 96 mg/dL (ref 70–140)

## 2013-02-27 LAB — HIV ANTIBODY (ROUTINE TESTING W REFLEX): HIV: NONREACTIVE

## 2013-02-27 LAB — RPR

## 2013-03-13 ENCOUNTER — Ambulatory Visit (INDEPENDENT_AMBULATORY_CARE_PROVIDER_SITE_OTHER): Payer: Self-pay | Admitting: Family

## 2013-03-13 VITALS — BP 102/62 | Temp 98.7°F | Wt 143.0 lb

## 2013-03-13 DIAGNOSIS — O34219 Maternal care for unspecified type scar from previous cesarean delivery: Secondary | ICD-10-CM

## 2013-03-13 LAB — POCT URINALYSIS DIP (DEVICE)
BILIRUBIN URINE: NEGATIVE
GLUCOSE, UA: NEGATIVE mg/dL
Hgb urine dipstick: NEGATIVE
Ketones, ur: NEGATIVE mg/dL
Leukocytes, UA: NEGATIVE
NITRITE: NEGATIVE
Protein, ur: NEGATIVE mg/dL
SPECIFIC GRAVITY, URINE: 1.02 (ref 1.005–1.030)
UROBILINOGEN UA: 0.2 mg/dL (ref 0.0–1.0)
pH: 7.5 (ref 5.0–8.0)

## 2013-03-13 NOTE — Progress Notes (Signed)
Pulse: 97

## 2013-03-13 NOTE — Progress Notes (Signed)
Pt expressed concern regarding TOLAC, pt desires it, however her family members have made her afraid to try.  Previous csection in [place] at 40 wks, vertical skin incision, uncertain uterine incision (likely transverse per practice in [place]).  Reviewed risks and benefits again of TOLAC versus repeat csection via interpreter [phone] on language line.  Emphasized with patient that it is her decision.  Pt plans to go back and discuss with family and will return with her answer at next visit.

## 2013-03-27 ENCOUNTER — Ambulatory Visit (INDEPENDENT_AMBULATORY_CARE_PROVIDER_SITE_OTHER): Payer: Self-pay | Admitting: Obstetrics and Gynecology

## 2013-03-27 VITALS — BP 99/69 | Temp 97.6°F | Wt 146.8 lb

## 2013-03-27 DIAGNOSIS — Z3480 Encounter for supervision of other normal pregnancy, unspecified trimester: Secondary | ICD-10-CM

## 2013-03-27 DIAGNOSIS — Z23 Encounter for immunization: Secondary | ICD-10-CM

## 2013-03-27 LAB — POCT URINALYSIS DIP (DEVICE)
Bilirubin Urine: NEGATIVE
Glucose, UA: NEGATIVE mg/dL
Hgb urine dipstick: NEGATIVE
Ketones, ur: NEGATIVE mg/dL
LEUKOCYTES UA: NEGATIVE
Nitrite: NEGATIVE
PROTEIN: NEGATIVE mg/dL
Specific Gravity, Urine: 1.025 (ref 1.005–1.030)
UROBILINOGEN UA: 1 mg/dL (ref 0.0–1.0)
pH: 7 (ref 5.0–8.0)

## 2013-03-27 MED ORDER — TETANUS-DIPHTH-ACELL PERTUSSIS 5-2.5-18.5 LF-MCG/0.5 IM SUSP: 0.5000 mL | Freq: Once | INTRAMUSCULAR | Status: DC

## 2013-03-27 NOTE — Progress Notes (Signed)
P-100 

## 2013-03-27 NOTE — Patient Instructions (Signed)
Third Trimester of Pregnancy  The third trimester is from week 29 through week 42, months 7 through 9. The third trimester is a time when the fetus is growing rapidly. At the end of the ninth month, the fetus is about 20 inches in length and weighs 6 10 pounds.   BODY CHANGES  Your body goes through many changes during pregnancy. The changes vary from woman to woman.    Your weight will continue to increase. You can expect to gain 25 35 pounds (11 16 kg) by the end of the pregnancy.   You may begin to get stretch marks on your hips, abdomen, and breasts.   You may urinate more often because the fetus is moving lower into your pelvis and pressing on your bladder.   You may develop or continue to have heartburn as a result of your pregnancy.   You may develop constipation because certain hormones are causing the muscles that push waste through your intestines to slow down.   You may develop hemorrhoids or swollen, bulging veins (varicose veins).   You may have pelvic pain because of the weight gain and pregnancy hormones relaxing your joints between the bones in your pelvis. Back aches may result from over exertion of the muscles supporting your posture.   Your breasts will continue to grow and be tender. A yellow discharge may leak from your breasts called colostrum.   Your belly button may stick out.   You may feel short of breath because of your expanding uterus.   You may notice the fetus "dropping," or moving lower in your abdomen.   You may have a bloody mucus discharge. This usually occurs a few days to a week before labor begins.   Your cervix becomes thin and soft (effaced) near your due date.  WHAT TO EXPECT AT YOUR PRENATAL EXAMS   You will have prenatal exams every 2 weeks until week 36. Then, you will have weekly prenatal exams. During a routine prenatal visit:   You will be weighed to make sure you and the fetus are growing normally.   Your blood pressure is taken.   Your abdomen will be  measured to track your baby's growth.   The fetal heartbeat will be listened to.   Any test results from the previous visit will be discussed.   You may have a cervical check near your due date to see if you have effaced.  At around 36 weeks, your caregiver will check your cervix. At the same time, your caregiver will also perform a test on the secretions of the vaginal tissue. This test is to determine if a type of bacteria, Group B streptococcus, is present. Your caregiver will explain this further.  Your caregiver may ask you:   What your birth plan is.   How you are feeling.   If you are feeling the baby move.   If you have had any abnormal symptoms, such as leaking fluid, bleeding, severe headaches, or abdominal cramping.   If you have any questions.  Other tests or screenings that may be performed during your third trimester include:   Blood tests that check for low iron levels (anemia).   Fetal testing to check the health, activity level, and growth of the fetus. Testing is done if you have certain medical conditions or if there are problems during the pregnancy.  FALSE LABOR  You may feel small, irregular contractions that eventually go away. These are called Braxton Hicks contractions, or   false labor. Contractions may last for hours, days, or even weeks before true labor sets in. If contractions come at regular intervals, intensify, or become painful, it is best to be seen by your caregiver.   SIGNS OF LABOR    Menstrual-like cramps.   Contractions that are 5 minutes apart or less.   Contractions that start on the top of the uterus and spread down to the lower abdomen and back.   A sense of increased pelvic pressure or back pain.   A watery or bloody mucus discharge that comes from the vagina.  If you have any of these signs before the 37th week of pregnancy, call your caregiver right away. You need to go to the hospital to get checked immediately.  HOME CARE INSTRUCTIONS    Avoid all  smoking, herbs, alcohol, and unprescribed drugs. These chemicals affect the formation and growth of the baby.   Follow your caregiver's instructions regarding medicine use. There are medicines that are either safe or unsafe to take during pregnancy.   Exercise only as directed by your caregiver. Experiencing uterine cramps is a good sign to stop exercising.   Continue to eat regular, healthy meals.   Wear a good support bra for breast tenderness.   Do not use hot tubs, steam rooms, or saunas.   Wear your seat belt at all times when driving.   Avoid raw meat, uncooked cheese, cat litter boxes, and soil used by cats. These carry germs that can cause birth defects in the baby.   Take your prenatal vitamins.   Try taking a stool softener (if your caregiver approves) if you develop constipation. Eat more high-fiber foods, such as fresh vegetables or fruit and whole grains. Drink plenty of fluids to keep your urine clear or pale yellow.   Take warm sitz baths to soothe any pain or discomfort caused by hemorrhoids. Use hemorrhoid cream if your caregiver approves.   If you develop varicose veins, wear support hose. Elevate your feet for 15 minutes, 3 4 times a day. Limit salt in your diet.   Avoid heavy lifting, wear low heal shoes, and practice good posture.   Rest a lot with your legs elevated if you have leg cramps or low back pain.   Visit your dentist if you have not gone during your pregnancy. Use a soft toothbrush to brush your teeth and be gentle when you floss.   A sexual relationship may be continued unless your caregiver directs you otherwise.   Do not travel far distances unless it is absolutely necessary and only with the approval of your caregiver.   Take prenatal classes to understand, practice, and ask questions about the labor and delivery.   Make a trial run to the hospital.   Pack your hospital bag.   Prepare the baby's nursery.   Continue to go to all your prenatal visits as directed  by your caregiver.  SEEK MEDICAL CARE IF:   You are unsure if you are in labor or if your water has broken.   You have dizziness.   You have mild pelvic cramps, pelvic pressure, or nagging pain in your abdominal area.   You have persistent nausea, vomiting, or diarrhea.   You have a bad smelling vaginal discharge.   You have pain with urination.  SEEK IMMEDIATE MEDICAL CARE IF:    You have a fever.   You are leaking fluid from your vagina.   You have spotting or bleeding from your vagina.     You have severe abdominal cramping or pain.   You have rapid weight loss or gain.   You have shortness of breath with chest pain.   You notice sudden or extreme swelling of your face, hands, ankles, feet, or legs.   You have not felt your baby move in over an hour.   You have severe headaches that do not go away with medicine.   You have vision changes.  Document Released: 01/04/2001 Document Revised: 09/12/2012 Document Reviewed: 03/13/2012  [employer] Patient Information 2014 [employer].

## 2013-03-27 NOTE — Progress Notes (Signed)
Doing well. No concerns. Reviewed that she is good TOLAC candidate.

## 2013-04-10 ENCOUNTER — Encounter: Payer: Self-pay | Admitting: Obstetrics and Gynecology

## 2013-04-10 ENCOUNTER — Ambulatory Visit (INDEPENDENT_AMBULATORY_CARE_PROVIDER_SITE_OTHER): Payer: Self-pay | Admitting: Obstetrics and Gynecology

## 2013-04-10 VITALS — BP 115/83 | Temp 98.1°F | Wt 147.0 lb

## 2013-04-10 DIAGNOSIS — O99820 Streptococcus B carrier state complicating pregnancy: Secondary | ICD-10-CM

## 2013-04-10 DIAGNOSIS — O9989 Other specified diseases and conditions complicating pregnancy, childbirth and the puerperium: Secondary | ICD-10-CM

## 2013-04-10 DIAGNOSIS — IMO0002 Reserved for concepts with insufficient information to code with codable children: Secondary | ICD-10-CM

## 2013-04-10 DIAGNOSIS — Z22330 Carrier of Group B streptococcus: Secondary | ICD-10-CM

## 2013-04-10 DIAGNOSIS — Z23 Encounter for immunization: Secondary | ICD-10-CM

## 2013-04-10 DIAGNOSIS — Z3480 Encounter for supervision of other normal pregnancy, unspecified trimester: Secondary | ICD-10-CM

## 2013-04-10 DIAGNOSIS — O34219 Maternal care for unspecified type scar from previous cesarean delivery: Secondary | ICD-10-CM

## 2013-04-10 LAB — POCT URINALYSIS DIP (DEVICE)
Bilirubin Urine: NEGATIVE
Glucose, UA: NEGATIVE mg/dL
Hgb urine dipstick: NEGATIVE
Ketones, ur: NEGATIVE mg/dL
Leukocytes, UA: NEGATIVE
NITRITE: NEGATIVE
Protein, ur: NEGATIVE mg/dL
Specific Gravity, Urine: 1.015 (ref 1.005–1.030)
UROBILINOGEN UA: 0.2 mg/dL (ref 0.0–1.0)
pH: 7 (ref 5.0–8.0)

## 2013-04-10 LAB — OB RESULTS CONSOLE GC/CHLAMYDIA
Chlamydia: NEGATIVE
GC PROBE AMP, GENITAL: NEGATIVE

## 2013-04-10 LAB — OB RESULTS CONSOLE GBS: GBS: POSITIVE

## 2013-04-10 NOTE — Progress Notes (Signed)
P=94 Pt has decided to have repeat cesarean section. FOB plans vasectomy.  GBS, G/C done.

## 2013-04-10 NOTE — Patient Instructions (Addendum)
Third Trimester of Pregnancy  The third trimester is from week 29 through week 42, months 7 through 9. The third trimester is a time when the fetus is growing rapidly. At the end of the ninth month, the fetus is about 20 inches in length and weighs 6 10 pounds.   BODY CHANGES  Your body goes through many changes during pregnancy. The changes vary from woman to woman.    Your weight will continue to increase. You can expect to gain 25 35 pounds (11 16 kg) by the end of the pregnancy.   You may begin to get stretch marks on your hips, abdomen, and breasts.   You may urinate more often because the fetus is moving lower into your pelvis and pressing on your bladder.   You may develop or continue to have heartburn as a result of your pregnancy.   You may develop constipation because certain hormones are causing the muscles that push waste through your intestines to slow down.   You may develop hemorrhoids or swollen, bulging veins (varicose veins).   You may have pelvic pain because of the weight gain and pregnancy hormones relaxing your joints between the bones in your pelvis. Back aches may result from over exertion of the muscles supporting your posture.   Your breasts will continue to grow and be tender. A yellow discharge may leak from your breasts called colostrum.   Your belly button may stick out.   You may feel short of breath because of your expanding uterus.   You may notice the fetus "dropping," or moving lower in your abdomen.   You may have a bloody mucus discharge. This usually occurs a few days to a week before labor begins.   Your cervix becomes thin and soft (effaced) near your due date.  WHAT TO EXPECT AT YOUR PRENATAL EXAMS   You will have prenatal exams every 2 weeks until week 36. Then, you will have weekly prenatal exams. During a routine prenatal visit:   You will be weighed to make sure you and the fetus are growing normally.   Your blood pressure is taken.   Your abdomen will be  measured to track your baby's growth.   The fetal heartbeat will be listened to.   Any test results from the previous visit will be discussed.   You may have a cervical check near your due date to see if you have effaced.  At around 36 weeks, your caregiver will check your cervix. At the same time, your caregiver will also perform a test on the secretions of the vaginal tissue. This test is to determine if a type of bacteria, Group B streptococcus, is present. Your caregiver will explain this further.  Your caregiver may ask you:   What your birth plan is.   How you are feeling.   If you are feeling the baby move.   If you have had any abnormal symptoms, such as leaking fluid, bleeding, severe headaches, or abdominal cramping.   If you have any questions.  Other tests or screenings that may be performed during your third trimester include:   Blood tests that check for low iron levels (anemia).   Fetal testing to check the health, activity level, and growth of the fetus. Testing is done if you have certain medical conditions or if there are problems during the pregnancy.  FALSE LABOR  You may feel small, irregular contractions that eventually go away. These are called Braxton Hicks contractions, or   false labor. Contractions may last for hours, days, or even weeks before true labor sets in. If contractions come at regular intervals, intensify, or become painful, it is best to be seen by your caregiver.   SIGNS OF LABOR    Menstrual-like cramps.   Contractions that are 5 minutes apart or less.   Contractions that start on the top of the uterus and spread down to the lower abdomen and back.   A sense of increased pelvic pressure or back pain.   A watery or bloody mucus discharge that comes from the vagina.  If you have any of these signs before the 37th week of pregnancy, call your caregiver right away. You need to go to the hospital to get checked immediately.  HOME CARE INSTRUCTIONS    Avoid all  smoking, herbs, alcohol, and unprescribed drugs. These chemicals affect the formation and growth of the baby.   Follow your caregiver's instructions regarding medicine use. There are medicines that are either safe or unsafe to take during pregnancy.   Exercise only as directed by your caregiver. Experiencing uterine cramps is a good sign to stop exercising.   Continue to eat regular, healthy meals.   Wear a good support bra for breast tenderness.   Do not use hot tubs, steam rooms, or saunas.   Wear your seat belt at all times when driving.   Avoid raw meat, uncooked cheese, cat litter boxes, and soil used by cats. These carry germs that can cause birth defects in the baby.   Take your prenatal vitamins.   Try taking a stool softener (if your caregiver approves) if you develop constipation. Eat more high-fiber foods, such as fresh vegetables or fruit and whole grains. Drink plenty of fluids to keep your urine clear or pale yellow.   Take warm sitz baths to soothe any pain or discomfort caused by hemorrhoids. Use hemorrhoid cream if your caregiver approves.   If you develop varicose veins, wear support hose. Elevate your feet for 15 minutes, 3 4 times a day. Limit salt in your diet.   Avoid heavy lifting, wear low heal shoes, and practice good posture.   Rest a lot with your legs elevated if you have leg cramps or low back pain.   Visit your dentist if you have not gone during your pregnancy. Use a soft toothbrush to brush your teeth and be gentle when you floss.   A sexual relationship may be continued unless your caregiver directs you otherwise.   Do not travel far distances unless it is absolutely necessary and only with the approval of your caregiver.   Take prenatal classes to understand, practice, and ask questions about the labor and delivery.   Make a trial run to the hospital.   Pack your hospital bag.   Prepare the baby's nursery.   Continue to go to all your prenatal visits as directed  by your caregiver.  SEEK MEDICAL CARE IF:   You are unsure if you are in labor or if your water has broken.   You have dizziness.   You have mild pelvic cramps, pelvic pressure, or nagging pain in your abdominal area.   You have persistent nausea, vomiting, or diarrhea.   You have a bad smelling vaginal discharge.   You have pain with urination.  SEEK IMMEDIATE MEDICAL CARE IF:    You have a fever.   You are leaking fluid from your vagina.   You have spotting or bleeding from your vagina.     You have severe abdominal cramping or pain.   You have rapid weight loss or gain.   You have shortness of breath with chest pain.   You notice sudden or extreme swelling of your face, hands, ankles, feet, or legs.   You have not felt your baby move in over an hour.   You have severe headaches that do not go away with medicine.   You have vision changes.  Document Released: 01/04/2001 Document Revised: 09/12/2012 Document Reviewed: 03/13/2012  [employer] Patient Information 2014 [employer].

## 2013-04-11 LAB — GC/CHLAMYDIA PROBE AMP
CT Probe RNA: NEGATIVE
GC Probe RNA: NEGATIVE

## 2013-04-14 LAB — CULTURE, STREPTOCOCCUS GRP B W/SUSCEPT

## 2013-04-19 DIAGNOSIS — O99820 Streptococcus B carrier state complicating pregnancy: Secondary | ICD-10-CM | POA: Insufficient documentation

## 2013-04-24 ENCOUNTER — Encounter: Payer: Self-pay | Admitting: *Deleted

## 2013-04-24 ENCOUNTER — Ambulatory Visit (INDEPENDENT_AMBULATORY_CARE_PROVIDER_SITE_OTHER): Payer: Self-pay | Admitting: Advanced Practice Midwife

## 2013-04-24 VITALS — BP 116/74 | Temp 97.0°F | Wt 145.1 lb

## 2013-04-24 DIAGNOSIS — O34219 Maternal care for unspecified type scar from previous cesarean delivery: Secondary | ICD-10-CM

## 2013-04-24 DIAGNOSIS — Z22330 Carrier of Group B streptococcus: Secondary | ICD-10-CM

## 2013-04-24 DIAGNOSIS — O99891 Other specified diseases and conditions complicating pregnancy: Secondary | ICD-10-CM

## 2013-04-24 DIAGNOSIS — O9989 Other specified diseases and conditions complicating pregnancy, childbirth and the puerperium: Secondary | ICD-10-CM

## 2013-04-24 DIAGNOSIS — IMO0002 Reserved for concepts with insufficient information to code with codable children: Secondary | ICD-10-CM

## 2013-04-24 DIAGNOSIS — O99820 Streptococcus B carrier state complicating pregnancy: Secondary | ICD-10-CM

## 2013-04-24 LAB — POCT URINALYSIS DIP (DEVICE)
Bilirubin Urine: NEGATIVE
GLUCOSE, UA: NEGATIVE mg/dL
HGB URINE DIPSTICK: NEGATIVE
KETONES UR: NEGATIVE mg/dL
LEUKOCYTES UA: NEGATIVE
Nitrite: NEGATIVE
PH: 6.5 (ref 5.0–8.0)
Protein, ur: NEGATIVE mg/dL
Specific Gravity, Urine: 1.025 (ref 1.005–1.030)
Urobilinogen, UA: 0.2 mg/dL (ref 0.0–1.0)

## 2013-04-24 NOTE — Progress Notes (Signed)
Pulse 86 Patient reports back pain.

## 2013-04-24 NOTE — Progress Notes (Signed)
Doing well. Does want a repeat C/S. Reviewed usually do them at 39 wks. They would prefer the 12th if possible. Message sent to Admin Asst.

## 2013-04-24 NOTE — Patient Instructions (Signed)
Cesarean Delivery  Cesarean delivery is the birth of a baby through a cut (incision) in the abdomen and womb (uterus).  LET YOUR HEALTH CARE PROVIDER KNOW ABOUT:  All medicines you are taking, including vitamins, herbs, eye drops, creams, and over-the-counter medicines.  Previous problems you or members of your family have had with the use of anesthetics.  Any blood disorders you have.  Previous surgeries you have had.  Medical conditions you have.  Any allergies you have.  Complicationsinvolving the pregnancy. RISKS AND COMPLICATIONS  Generally, this is a safe procedure. However, as with any procedure, complications can occur. Possible complications include:  Bleeding.  Infection.  Blood clots.  Injury to surrounding organs.  Problems with anesthesia.  Injury to the baby. BEFORE THE PROCEDURE   You may be given an antacid medicine to drink. This will prevent acid contents in your stomach from going into your lungs if you vomit during the surgery.  You may be given an antibiotic medicine to prevent infection. PROCEDURE   Hair may be removed from your pubic area and your lower abdomen. This is to prevent infection in the incision site.  A tube (Foley catheter) will be placed in your bladder to drain your urine from your bladder into a bag. This keeps your bladder empty during surgery.  An IV tube will be placed in your vein.  You may be given medicine to numb the lower half of your body (regional anesthetic). If you were in labor, you may have already had an epidural in place which can be used in both labor and cesarean delivery. You may possibly be given medicine to make you sleep (general anesthetic) though this is not as common.  An incision will be made in your abdomen that extends to your uterus. There are 2 basic kinds of incisions:  The horizontal (transverse) incision. Horizontal incisions are from side to side and are used for most routine cesarean  deliveries.  The vertical incision. The vertical incision is from the top of the abdomen to the bottom and is less commonly used. It is often done for women who have a serious complication (extreme prematurity) or under emergency situations.  The horizontal and vertical incisions may both be used at the same time. However, this is very uncommon.  An incision is then made in your uterus to deliver the baby.  Your baby will then be delivered.  Both incisions are then closed with absorbable stitches. AFTER THE PROCEDURE   If you were awake during the surgery, you will see your baby right away. If you were asleep, you will see your baby as soon as you are awake.  You may breastfeed your baby after surgery.  You may be able to get up and walk the same day as the surgery. If you need to stay in bed for a period of time, you will receive help to turn, cough, and take deep breaths after surgery. This helps prevent lung problems such as pneumonia.  Do not get out of bed alone the first time after surgery. You will need help getting out of bed until you are able to do this by yourself.  You may be able to shower the day after your cesarean delivery. After the bandage (dressing) is taken off the incision site, a nurse will assist you to shower if you would like help.  You will have pneumatic compression hose placed on your lower legs. This is done to prevent blood clots. When you are up   and walking regularly, they will no longer be necessary.  Do not cross your legs when you sit.  Save any blood clots that you pass. If you pass a clot while on the toilet, do not flush it. Call for the nurse. Tell the nurse if you think you are bleeding too much or passing too many clots.  You will be given medicine as needed. Let your health care providers know if you are hurting. You may also be given an antibiotic to prevent an infection.  Your IV tube will be taken out when you are drinking a reasonable  amount of fluids. The Foley catheter is taken out when you are up and walking.  If your blood type is Rh negative and your baby's blood type is Rh positive, you will be given a shot of anti-D immune globulin. This shot prevents you from having Rh problems with a future pregnancy. You should get the shot even if you had your tubes tied (tubal ligation).  If you are allowed to take the baby for a walk, place the baby in the bassinet and push it. Do not carry your baby in your arms. Document Released: 01/10/2005 Document Revised: 10/31/2012 Document Reviewed: 08/01/2012 Exit[employer] Patient Information 2014 [employer].  

## 2013-05-02 ENCOUNTER — Inpatient Hospital Stay (HOSPITAL_COMMUNITY)
Admission: RE | Admit: 2013-05-02 | Discharge: 2013-05-04 | DRG: 766 | Disposition: A | Discharge status: Home / Self Care | Payer: Self-pay | Source: Ambulatory Visit | Attending: Family Medicine | Admitting: Family Medicine

## 2013-05-02 ENCOUNTER — Encounter (HOSPITAL_COMMUNITY)
Admission: RE | Disposition: A | Discharge status: Home / Self Care | Payer: Self-pay | Source: Ambulatory Visit | Attending: Family Medicine

## 2013-05-02 ENCOUNTER — Encounter (HOSPITAL_COMMUNITY): Payer: Self-pay | Admitting: Anesthesiology

## 2013-05-02 ENCOUNTER — Inpatient Hospital Stay (HOSPITAL_COMMUNITY): Payer: Self-pay | Admitting: Anesthesiology

## 2013-05-02 ENCOUNTER — Encounter (HOSPITAL_COMMUNITY): Payer: Self-pay | Admitting: *Deleted

## 2013-05-02 DIAGNOSIS — Z87891 Personal history of nicotine dependence: Secondary | ICD-10-CM

## 2013-05-02 DIAGNOSIS — D649 Anemia, unspecified: Secondary | ICD-10-CM | POA: Diagnosis not present

## 2013-05-02 DIAGNOSIS — Z22330 Carrier of Group B streptococcus: Secondary | ICD-10-CM

## 2013-05-02 DIAGNOSIS — O9903 Anemia complicating the puerperium: Secondary | ICD-10-CM | POA: Diagnosis not present

## 2013-05-02 DIAGNOSIS — O99892 Other specified diseases and conditions complicating childbirth: Secondary | ICD-10-CM

## 2013-05-02 DIAGNOSIS — O34219 Maternal care for unspecified type scar from previous cesarean delivery: Secondary | ICD-10-CM

## 2013-05-02 DIAGNOSIS — O9989 Other specified diseases and conditions complicating pregnancy, childbirth and the puerperium: Secondary | ICD-10-CM

## 2013-05-02 LAB — TYPE AND SCREEN
ABO/RH(D): A POS
Antibody Screen: NEGATIVE

## 2013-05-02 LAB — CBC
HEMATOCRIT: 33.2 % — AB (ref 36.0–46.0)
Hemoglobin: 10.7 g/dL — ABNORMAL LOW (ref 12.0–15.0)
MCH: 23.9 pg — ABNORMAL LOW (ref 26.0–34.0)
MCHC: 32.2 g/dL (ref 30.0–36.0)
MCV: 74.1 fL — AB (ref 78.0–100.0)
Platelets: 328 10*3/uL (ref 150–400)
RBC: 4.48 MIL/uL (ref 3.87–5.11)
RDW: 14.9 % (ref 11.5–15.5)
WBC: 9 10*3/uL (ref 4.0–10.5)

## 2013-05-02 LAB — RPR

## 2013-05-02 SURGERY — Surgical Case
Anesthesia: Spinal | Site: Abdomen

## 2013-05-02 MED ORDER — WITCH HAZEL-GLYCERIN EX PADS: 1.0000 "application " | MEDICATED_PAD | CUTANEOUS | Status: DC | PRN

## 2013-05-02 MED ORDER — BUPIVACAINE IN DEXTROSE 0.75-8.25 % IT SOLN
INTRATHECAL | Status: DC | PRN
  Administered 2013-05-02: 1.6 mL via INTRATHECAL

## 2013-05-02 MED ORDER — SENNOSIDES-DOCUSATE SODIUM 8.6-50 MG PO TABS
2.0000 | ORAL_TABLET | ORAL | Status: DC
  Administered 2013-05-02 – 2013-05-04 (×2): 2 via ORAL
  Filled 2013-05-02 (×2): qty 2

## 2013-05-02 MED ORDER — BUPIVACAINE HCL (PF) 0.25 % IJ SOLN
INTRAMUSCULAR | Status: AC
  Filled 2013-05-02: qty 30

## 2013-05-02 MED ORDER — DIPHENHYDRAMINE HCL 25 MG PO CAPS: 25.0000 mg | ORAL_CAPSULE | ORAL | Status: DC | PRN

## 2013-05-02 MED ORDER — PROMETHAZINE HCL 25 MG/ML IJ SOLN: 6.2500 mg | INTRAMUSCULAR | Status: DC | PRN

## 2013-05-02 MED ORDER — MEPERIDINE HCL 25 MG/ML IJ SOLN
INTRAMUSCULAR | Status: DC | PRN
  Administered 2013-05-02 (×2): 12.5 mg via INTRAVENOUS

## 2013-05-02 MED ORDER — MEPERIDINE HCL 25 MG/ML IJ SOLN: 6.2500 mg | INTRAMUSCULAR | Status: DC | PRN

## 2013-05-02 MED ORDER — LACTATED RINGERS IV SOLN
Freq: Once | INTRAVENOUS | Status: AC
  Administered 2013-05-02: 14:00:00 via INTRAVENOUS

## 2013-05-02 MED ORDER — NALOXONE HCL 0.4 MG/ML IJ SOLN: 0.4000 mg | INTRAMUSCULAR | Status: DC | PRN

## 2013-05-02 MED ORDER — MORPHINE SULFATE (PF) 0.5 MG/ML IJ SOLN
INTRAMUSCULAR | Status: DC | PRN
  Administered 2013-05-02: .2 mg via EPIDURAL
  Administered 2013-05-02: 4.8 mg via EPIDURAL

## 2013-05-02 MED ORDER — ONDANSETRON HCL 4 MG/2ML IJ SOLN: 4.0000 mg | Freq: Three times a day (TID) | INTRAMUSCULAR | Status: DC | PRN

## 2013-05-02 MED ORDER — FENTANYL CITRATE 0.05 MG/ML IJ SOLN
INTRAMUSCULAR | Status: AC
  Filled 2013-05-02: qty 2

## 2013-05-02 MED ORDER — SIMETHICONE 80 MG PO CHEW
80.0000 mg | CHEWABLE_TABLET | ORAL | Status: DC
  Administered 2013-05-02 – 2013-05-04 (×2): 80 mg via ORAL
  Filled 2013-05-02 (×2): qty 1

## 2013-05-02 MED ORDER — ONDANSETRON HCL 4 MG/2ML IJ SOLN: 4.0000 mg | INTRAMUSCULAR | Status: DC | PRN

## 2013-05-02 MED ORDER — SODIUM CHLORIDE 0.9 % IJ SOLN: 3.0000 mL | INTRAMUSCULAR | Status: DC | PRN

## 2013-05-02 MED ORDER — OXYCODONE-ACETAMINOPHEN 5-325 MG PO TABS
1.0000 | ORAL_TABLET | ORAL | Status: DC | PRN
  Filled 2013-05-02: qty 1

## 2013-05-02 MED ORDER — DIBUCAINE 1 % RE OINT: 1.0000 "application " | TOPICAL_OINTMENT | RECTAL | Status: DC | PRN

## 2013-05-02 MED ORDER — SCOPOLAMINE 1 MG/3DAYS TD PT72
1.0000 | MEDICATED_PATCH | Freq: Once | TRANSDERMAL | Status: DC
  Administered 2013-05-02: 1.5 mg via TRANSDERMAL

## 2013-05-02 MED ORDER — PRENATAL MULTIVITAMIN CH
1.0000 | ORAL_TABLET | Freq: Every day | ORAL | Status: DC
  Administered 2013-05-03: 1 via ORAL
  Filled 2013-05-02: qty 1

## 2013-05-02 MED ORDER — CEFAZOLIN SODIUM-DEXTROSE 2-3 GM-% IV SOLR: 2.0000 g | INTRAVENOUS | Status: DC

## 2013-05-02 MED ORDER — LANOLIN HYDROUS EX OINT: 1.0000 "application " | TOPICAL_OINTMENT | CUTANEOUS | Status: DC | PRN

## 2013-05-02 MED ORDER — FENTANYL CITRATE 0.05 MG/ML IJ SOLN
INTRAMUSCULAR | Status: DC | PRN
  Administered 2013-05-02: 75 ug via INTRAVENOUS
  Administered 2013-05-02: 25 ug via INTRAVENOUS

## 2013-05-02 MED ORDER — DEXTROSE 5 % IV SOLN: 1.0000 ug/kg/h | INTRAVENOUS | Status: DC | PRN

## 2013-05-02 MED ORDER — NALBUPHINE HCL 10 MG/ML IJ SOLN: 5.0000 mg | INTRAMUSCULAR | Status: DC | PRN

## 2013-05-02 MED ORDER — PHENYLEPHRINE 8 MG IN D5W 100 ML (0.08MG/ML) PREMIX OPTIME
INJECTION | INTRAVENOUS | Status: AC
  Filled 2013-05-02: qty 100

## 2013-05-02 MED ORDER — SIMETHICONE 80 MG PO CHEW: 80.0000 mg | CHEWABLE_TABLET | ORAL | Status: DC | PRN

## 2013-05-02 MED ORDER — LACTATED RINGERS IV SOLN: INTRAVENOUS | Status: DC

## 2013-05-02 MED ORDER — MEPERIDINE HCL 25 MG/ML IJ SOLN
INTRAMUSCULAR | Status: AC
  Filled 2013-05-02: qty 1

## 2013-05-02 MED ORDER — TETANUS-DIPHTH-ACELL PERTUSSIS 5-2.5-18.5 LF-MCG/0.5 IM SUSP: 0.5000 mL | Freq: Once | INTRAMUSCULAR | Status: DC

## 2013-05-02 MED ORDER — ONDANSETRON HCL 4 MG/2ML IJ SOLN
INTRAMUSCULAR | Status: DC | PRN
  Administered 2013-05-02: 4 mg via INTRAVENOUS

## 2013-05-02 MED ORDER — ZOLPIDEM TARTRATE 5 MG PO TABS: 5.0000 mg | ORAL_TABLET | Freq: Every evening | ORAL | Status: DC | PRN

## 2013-05-02 MED ORDER — LACTATED RINGERS IV SOLN
INTRAVENOUS | Status: DC | PRN
  Administered 2013-05-02: 15:00:00 via INTRAVENOUS

## 2013-05-02 MED ORDER — CEFAZOLIN SODIUM-DEXTROSE 2-3 GM-% IV SOLR
INTRAVENOUS | Status: AC
  Administered 2013-05-02: 2 g via INTRAVENOUS
  Filled 2013-05-02: qty 50

## 2013-05-02 MED ORDER — KETOROLAC TROMETHAMINE 30 MG/ML IJ SOLN
30.0000 mg | Freq: Four times a day (QID) | INTRAMUSCULAR | Status: AC | PRN
  Administered 2013-05-02: 30 mg via INTRAVENOUS

## 2013-05-02 MED ORDER — SCOPOLAMINE 1 MG/3DAYS TD PT72: 1.0000 | MEDICATED_PATCH | Freq: Once | TRANSDERMAL | Status: DC

## 2013-05-02 MED ORDER — METOCLOPRAMIDE HCL 5 MG/ML IJ SOLN: 10.0000 mg | Freq: Three times a day (TID) | INTRAMUSCULAR | Status: DC | PRN

## 2013-05-02 MED ORDER — OXYTOCIN 10 UNIT/ML IJ SOLN
INTRAMUSCULAR | Status: AC
  Filled 2013-05-02: qty 4

## 2013-05-02 MED ORDER — SCOPOLAMINE 1 MG/3DAYS TD PT72
MEDICATED_PATCH | TRANSDERMAL | Status: AC
  Administered 2013-05-02: 1.5 mg via TRANSDERMAL
  Filled 2013-05-02: qty 1

## 2013-05-02 MED ORDER — IBUPROFEN 600 MG PO TABS
600.0000 mg | ORAL_TABLET | Freq: Four times a day (QID) | ORAL | Status: DC
  Administered 2013-05-02 – 2013-05-04 (×6): 600 mg via ORAL
  Filled 2013-05-02 (×7): qty 1

## 2013-05-02 MED ORDER — DIPHENHYDRAMINE HCL 25 MG PO CAPS: 25.0000 mg | ORAL_CAPSULE | Freq: Four times a day (QID) | ORAL | Status: DC | PRN

## 2013-05-02 MED ORDER — DIPHENHYDRAMINE HCL 50 MG/ML IJ SOLN: 12.5000 mg | INTRAMUSCULAR | Status: DC | PRN

## 2013-05-02 MED ORDER — ONDANSETRON HCL 4 MG PO TABS
4.0000 mg | ORAL_TABLET | ORAL | Status: DC | PRN
Start: 2013-05-02 — End: 2013-05-04

## 2013-05-02 MED ORDER — ONDANSETRON HCL 4 MG/2ML IJ SOLN
INTRAMUSCULAR | Status: AC
  Filled 2013-05-02: qty 2

## 2013-05-02 MED ORDER — OXYTOCIN 40 UNITS IN LACTATED RINGERS INFUSION - SIMPLE MED: 62.5000 mL/h | INTRAVENOUS | Status: AC

## 2013-05-02 MED ORDER — MORPHINE SULFATE 0.5 MG/ML IJ SOLN
INTRAMUSCULAR | Status: AC
  Filled 2013-05-02: qty 10

## 2013-05-02 MED ORDER — CEFAZOLIN SODIUM-DEXTROSE 2-3 GM-% IV SOLR
INTRAVENOUS | Status: AC
  Filled 2013-05-02: qty 50

## 2013-05-02 MED ORDER — OXYTOCIN 10 UNIT/ML IJ SOLN
40.0000 [IU] | INTRAVENOUS | Status: DC | PRN
  Administered 2013-05-02: 40 [IU] via INTRAVENOUS

## 2013-05-02 MED ORDER — KETOROLAC TROMETHAMINE 30 MG/ML IJ SOLN
INTRAMUSCULAR | Status: AC
  Administered 2013-05-02: 30 mg via INTRAVENOUS
  Filled 2013-05-02: qty 1

## 2013-05-02 MED ORDER — DIPHENHYDRAMINE HCL 50 MG/ML IJ SOLN: 25.0000 mg | INTRAMUSCULAR | Status: DC | PRN

## 2013-05-02 MED ORDER — PHENYLEPHRINE 8 MG IN D5W 100 ML (0.08MG/ML) PREMIX OPTIME
INJECTION | INTRAVENOUS | Status: DC | PRN
  Administered 2013-05-02: 60 ug/min via INTRAVENOUS

## 2013-05-02 MED ORDER — LACTATED RINGERS IV SOLN
INTRAVENOUS | Status: DC
  Administered 2013-05-02: 14:00:00 via INTRAVENOUS

## 2013-05-02 MED ORDER — KETOROLAC TROMETHAMINE 30 MG/ML IJ SOLN
30.0000 mg | Freq: Four times a day (QID) | INTRAMUSCULAR | Status: AC | PRN
Start: 2013-05-02 — End: 2013-05-03

## 2013-05-02 MED ORDER — BUPIVACAINE HCL (PF) 0.25 % IJ SOLN
INTRAMUSCULAR | Status: DC | PRN
  Administered 2013-05-02: 30 mL

## 2013-05-02 MED ORDER — MENTHOL 3 MG MT LOZG: 1.0000 | LOZENGE | OROMUCOSAL | Status: DC | PRN

## 2013-05-02 MED ORDER — SIMETHICONE 80 MG PO CHEW
80.0000 mg | CHEWABLE_TABLET | Freq: Three times a day (TID) | ORAL | Status: DC
  Administered 2013-05-03 – 2013-05-04 (×4): 80 mg via ORAL
  Filled 2013-05-02 (×4): qty 1

## 2013-05-02 MED ORDER — FENTANYL CITRATE 0.05 MG/ML IJ SOLN: 25.0000 ug | INTRAMUSCULAR | Status: DC | PRN

## 2013-05-02 SURGICAL SUPPLY — 32 items
BENZOIN TINCTURE PRP APPL 2/3 (GAUZE/BANDAGES/DRESSINGS) ×3 IMPLANT
CLAMP CORD UMBIL (MISCELLANEOUS) IMPLANT
CLOSURE WOUND 1/2 X4 (GAUZE/BANDAGES/DRESSINGS) ×1
CLOTH BEACON ORANGE TIMEOUT ST (SAFETY) ×3 IMPLANT
DRAPE LG THREE QUARTER DISP (DRAPES) IMPLANT
DRSG OPSITE POSTOP 4X10 (GAUZE/BANDAGES/DRESSINGS) ×3 IMPLANT
DURAPREP 26ML APPLICATOR (WOUND CARE) ×3 IMPLANT
ELECT REM PT RETURN 9FT ADLT (ELECTROSURGICAL) ×3
ELECTRODE REM PT RTRN 9FT ADLT (ELECTROSURGICAL) ×1 IMPLANT
EXTRACTOR VACUUM M CUP 4 TUBE (SUCTIONS) IMPLANT
EXTRACTOR VACUUM M CUP 4' TUBE (SUCTIONS)
GLOVE BIOGEL PI IND STRL 7.5 (GLOVE) ×2 IMPLANT
GLOVE BIOGEL PI INDICATOR 7.5 (GLOVE) ×4
GLOVE ECLIPSE 7.5 STRL STRAW (GLOVE) ×3 IMPLANT
GOWN STRL REUS W/TWL LRG LVL3 (GOWN DISPOSABLE) ×9 IMPLANT
KIT ABG SYR 3ML LUER SLIP (SYRINGE) IMPLANT
NEEDLE HYPO 22GX1.5 SAFETY (NEEDLE) ×3 IMPLANT
NEEDLE HYPO 25X5/8 SAFETYGLIDE (NEEDLE) IMPLANT
NS IRRIG 1000ML POUR BTL (IV SOLUTION) ×3 IMPLANT
PACK C SECTION WH (CUSTOM PROCEDURE TRAY) ×3 IMPLANT
PAD ABD 7.5X8 STRL (GAUZE/BANDAGES/DRESSINGS) ×3 IMPLANT
PAD OB MATERNITY 4.3X12.25 (PERSONAL CARE ITEMS) ×3 IMPLANT
RTRCTR C-SECT PINK 25CM LRG (MISCELLANEOUS) ×3 IMPLANT
SPONGE GAUZE 4X4 12PLY (GAUZE/BANDAGES/DRESSINGS) ×3 IMPLANT
STRIP CLOSURE SKIN 1/2X4 (GAUZE/BANDAGES/DRESSINGS) ×2 IMPLANT
SUT VIC AB 0 CTX 36 (SUTURE) ×8
SUT VIC AB 0 CTX36XBRD ANBCTRL (SUTURE) ×4 IMPLANT
SUT VIC AB 4-0 KS 27 (SUTURE) ×3 IMPLANT
SYR 30ML LL (SYRINGE) ×3 IMPLANT
TOWEL OR 17X24 6PK STRL BLUE (TOWEL DISPOSABLE) ×3 IMPLANT
TRAY FOLEY CATH 14FR (SET/KITS/TRAYS/PACK) ×3 IMPLANT
WATER STERILE IRR 1000ML POUR (IV SOLUTION) ×3 IMPLANT

## 2013-05-02 NOTE — Anesthesia Procedure Notes (Addendum)
Spinal  Patient location during procedure: OB Start time: 05/02/2013 2:43 PM End time: 05/02/2013 2:49 PM Staffing Anesthesiologist: Lynnetta Tom, CHRIS Performed by: anesthesiologist  Preanesthetic Checklist Completed: patient identified, surgical consent, pre-op evaluation, timeout performed, IV checked, risks and benefits discussed and monitors and equipment checked Spinal Block Patient position: sitting Prep: site prepped and draped and DuraPrep Patient monitoring: heart rate, cardiac monitor, continuous pulse ox and blood pressure Approach: midline Location: L3-4 Injection technique: single-shot Needle Needle type: Pencan  Needle gauge: 24 G Needle length: 10 cm Assessment Sensory level: T4

## 2013-05-02 NOTE — Consult Note (Signed)
28 y.o. female [phone] at 3243w0d presenting for elective repeat cesarean section.  A+, other labs negative except GBS +. Treatment not recommended as infant was born by c-section and ROM was at delivery.  Infant vigorous with APGARs 9 and 9.  No resuscitation needed except routine warming and drying. Exam within normal limits.  Admit to central nursery for routine newborn care.

## 2013-05-02 NOTE — H&P (Signed)
Paige Love is a 28 y.o. female [phone] with IUP at 5366w0d presenting for elective repeat cesarean section. Pt states she has been having no contractions, no vaginal bleeding, intact membranes, with active fetal movement.    [facility]  with [facility] of care at 11 weeks  Pregnancy complications or risks: Patient Active Problem List   Diagnosis Date Noted  . 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 Supervision of normal subsequent pregnancy 10/23/2012  . Previous cesarean delivery, antepartum condition or complication 10/23/2012   She desires to have husband have vasectomy.  She plans to plans to breastfeed  Prenatal labs and studies: ABO, Rh: --/--/A POS (04/09 1300) Antibody: NEG (04/09 1300) Rubella:  immune RPR: NON REAC (02/03 1650)  HBsAg: NEGATIVE (09/30 1504)  HIV: NON REACTIVE (02/03 1650)  GBS: Positive (03/18 0000)  1 hr Glucola 96 Genetic screening declined Anatomy US normal  Past Medical History:  Past Medical History  Diagnosis Date  . Medical history non-contributory     Past Surgical History:  Past Surgical History  Procedure Laterality Date  . Cesarean section    . Dilation and curettage of uterus      Obstetrical History:  OB History   Grav Para Term Preterm Abortions TAB SAB Ect Mult Living   5 1 1  3  3   1       Social History:  History   Social History  . Marital Status: Single    Spouse Name: N/A    Number of Children: N/A  . Years of Education: N/A   Social History Main Topics  . Smoking status: Former Smoker    Quit date: 08/20/2012  . Smokeless tobacco: Never Used  . Alcohol Use: No  . Drug Use: No  . Sexual Activity: Yes   Other Topics Concern  . None   Social History Narrative  . None    Family History:  Family History  Problem Relation Age of Onset  . Heart disease Mother   . Kidney disease Mother   . Thyroid disease Mother   .  Hypertension Father     Medications:  Prenatal vitamins,  Current Facility-Administered Medications  Medication Dose Route Frequency Provider Last Rate Last Dose  . ceFAZolin (ANCEF) 2-3 GM-% IVPB SOLR           . lactated ringers infusion   Intravenous Continuous Corky Soxhris Moser, MD      . scopolamine (TRANSDERM-SCOP) 1 MG/3DAYS 1.5 mg  1 patch Transdermal Once Corky Soxhris Moser, MD   1.5 mg at 05/02/13 1334    Allergies: No Known Allergies  Review of Systems: - negative  Physical Exam: Blood pressure 110/70, pulse 89, temperature 98.4 F (36.9 C), temperature source Oral, resp. rate 18, last menstrual period 07/27/2012, SpO2 100.00%. GENERAL: Well-developed, well-nourished female in no acute distress.  LUNGS: Clear to auscultation bilaterally.  HEART: Regular rate and rhythm. ABDOMEN: Soft, nontender, nondistended, gravid. EFW 7.5 lbs EXTREMITIES: Nontender, no edema, 2+ distal pulses. FHT:  Baseline rate 130 bpm     Assessment : Paige Limon Love is a 28 y.o. [phone] at 10666w0d being admitted for elective repeat  Plan: The risks of cesarean section discussed with the patient included but were not limited to: bleeding which may require transfusion or reoperation; infection which may require antibiotics; injury to bowel, bladder, ureters or other surrounding organs; injury to the fetus; need for additional procedures including hysterectomy in the  event of a life-threatening hemorrhage; placental abnormalities wth subsequent pregnancies, incisional problems, thromboembolic phenomenon and other postoperative/anesthesia complications. The patient concurred with the proposed plan, giving informed written consent for the procedure.   Patient has been NPO since midnight she will remain NPO for procedure. Anesthesia and OR aware.  Preoperative prophylactic Ancef ordered on call to the OR.  To OR when ready.   Levie Heritage 05/02/2013, 2:31 PM

## 2013-05-02 NOTE — Anesthesia Postprocedure Evaluation (Signed)
  Anesthesia Post-op Note  Patient: Paige Love  Procedure(s) Performed: Procedure(s): CESAREAN SECTION (N/A)  Patient Location: PACU  Anesthesia Type:Spinal  Level of Consciousness: awake and alert   Airway and Oxygen Therapy: Patient Spontanous Breathing  Post-op Pain: mild  Post-op Assessment: Post-op Vital signs reviewed, Patient's Cardiovascular Status Stable, Respiratory Function Stable, Patent Airway, No signs of Nausea or vomiting and Pain level controlled  Post-op Vital Signs: Reviewed and stable  Last Vitals:  Filed Vitals:   05/02/13 1645  BP: 108/59  Pulse: 80  Temp:   Resp: 18    Complications: No apparent anesthesia complications

## 2013-05-02 NOTE — Anesthesia Preprocedure Evaluation (Addendum)
Anesthesia Evaluation  Patient identified by MRN, date of birth, ID band Patient awake    Reviewed: Allergy & Precautions, H&P , NPO status , Patient's Chart, lab work & pertinent test results  History of Anesthesia Complications (+) history of anesthetic complications  Airway Mallampati: III TM Distance: >3 FB Neck ROM: Full    Dental  (+) Teeth Intact   Pulmonary neg shortness of breath, neg sleep apnea, neg COPDneg recent URI, former smoker,  breath sounds clear to auscultation        Cardiovascular negative cardio ROS  Rhythm:Regular     Neuro/Psych negative neurological ROS  negative psych ROS   GI/Hepatic negative GI ROS, Neg liver ROS,   Endo/Other  negative endocrine ROS  Renal/GU negative Renal ROS     Musculoskeletal negative musculoskeletal ROS (+)   Abdominal   Peds  Hematology  (+) anemia ,   Anesthesia Other Findings   Reproductive/Obstetrics (+) Pregnancy                          Anesthesia Physical Anesthesia Plan  ASA: II  Anesthesia Plan: Spinal   Post-op Pain Management:    Induction:   Airway Management Planned: Natural Airway  Additional Equipment: None  Intra-op Plan:   Post-operative Plan:   Informed Consent: I have reviewed the patients History and Physical, chart, labs and discussed the procedure including the risks, benefits and alternatives for the proposed anesthesia with the patient or authorized representative who has indicated his/her understanding and acceptance.   Dental advisory given  Plan Discussed with: CRNA and Surgeon  Anesthesia Plan Comments:        Anesthesia Quick Evaluation

## 2013-05-02 NOTE — Transfer of Care (Signed)
Immediate Anesthesia Transfer of Care Note  Patient: Paige Love  Procedure(s) Performed: Procedure(s): CESAREAN SECTION (N/A)  Patient Location: PACU  Anesthesia Type:Spinal  Level of Consciousness: awake, alert , oriented and patient cooperative  Airway & Oxygen Therapy: Patient Spontanous Breathing  Post-op Assessment: Report given to PACU RN and Post -op Vital signs reviewed and stable  Post vital signs: Reviewed and stable  Complications: No apparent anesthesia complications

## 2013-05-02 NOTE — Op Note (Signed)
Paige Love PROCEDURE DATE: 05/02/2013  PREOPERATIVE DIAGNOSIS: Intrauterine pregnancy at  6067w0d weeks gestation; previous uterine incision kerr x1  POSTOPERATIVE DIAGNOSIS: The same  PROCEDURE: Repeat Low Transverse Cesarean Section  SURGEON:  Dr. Candelaria CelesteJacob Stinson  ASSISTANT:  none  INDICATIONS: Paige Love is a 28 y.o. [id number] at 7867w0d scheduled for cesarean section secondary to previous uterine incision kerr.  The risks of cesarean section discussed with the patient included but were not limited to: bleeding which may require transfusion or reoperation; infection which may require antibiotics; injury to bowel, bladder, ureters or other surrounding organs; injury to the fetus; need for additional procedures including hysterectomy in the event of a life-threatening hemorrhage; placental abnormalities wth subsequent pregnancies, incisional problems, thromboembolic phenomenon and other postoperative/anesthesia complications. The patient concurred with the proposed plan, giving informed written consent for the procedure.    FINDINGS:  Viable female infant in cephalic presentation.  Apgars 9 and 9, weight pending.  Clear amniotic fluid.  Intact placenta, three vessel cord.  Normal uterus, fallopian tubes and ovaries bilaterally.  ANESTHESIA:    Spinal INTRAVENOUS FLUIDS: 2000 ml ESTIMATED BLOOD LOSS: 600 ml URINE OUTPUT:  100 ml SPECIMENS: Placenta sent to L&D COMPLICATIONS: None immediate  PROCEDURE IN DETAIL:  The patient received intravenous antibiotics and had sequential compression devices applied to her lower extremities while in the preoperative area.  She was then taken to the operating room where spinal anesthesia was administered and was found to be adequate. She was then placed in a dorsal supine position with a leftward tilt, and prepped and draped in a sterile manner.  A foley catheter was placed into her bladder and attached to constant gravity, which drained  clear fluid throughout.  After an adequate timeout was performed, a Pfannenstiel skin incision was made with scalpel and carried through to the underlying layer of fascia. The fascia was incised in the midline and this incision was extended bilaterally using the Mayo scissors. Kocher clamps were applied to the superior aspect of the fascial incision and the underlying rectus muscles were dissected off bluntly. A similar process was carried out on the inferior aspect of the facial incision. The rectus muscles were separated in the midline bluntly and the peritoneum was entered bluntly. An Alexis retractor was placed to aid in visualization of the uterus.  Attention was turned to the lower uterine segment where a transverse hysterotomy was made with a scalpel and extended bilaterally bluntly. The infant was successfully delivered, and cord was clamped and cut and infant was handed over to awaiting neonatology team. Uterine massage was then administered and the placenta delivered intact with three-vessel cord. The uterus was then cleared of clot and debris.  The hysterotomy was closed with 0 Vicryl in a running locked fashion, and an imbricating layer was also placed with a 0 Vicryl. Overall, excellent hemostasis was noted. The abdomen and the pelvis were cleared of all clot and debris and the Jon Gillslexis was removed. Hemostasis was confirmed on all surfaces.  The peritoneum and rectus muscles were reapproximated using 0 vicryl running stitches. The fascia was then closed using 0 Vicryl in a running fashion.  The subcutaneous layer was reapproximated with plain gut and the skin was closed with 4-0 vicryl. The patient tolerated the procedure well. Sponge, lap, instrument and needle counts were correct x 2. She was taken to the recovery room in stable condition.    Rhona RaiderJacob J Stinson DO 05/02/2013 3:43 PM

## 2013-05-03 ENCOUNTER — Encounter (HOSPITAL_COMMUNITY): Payer: Self-pay | Admitting: Family Medicine

## 2013-05-03 ENCOUNTER — Encounter: Payer: Self-pay | Admitting: Family Medicine

## 2013-05-03 LAB — CBC
HCT: 25.1 % — ABNORMAL LOW (ref 36.0–46.0)
Hemoglobin: 7.8 g/dL — ABNORMAL LOW (ref 12.0–15.0)
MCH: 23.1 pg — ABNORMAL LOW (ref 26.0–34.0)
MCHC: 31.1 g/dL (ref 30.0–36.0)
MCV: 74.5 fL — ABNORMAL LOW (ref 78.0–100.0)
Platelets: 259 10*3/uL (ref 150–400)
RBC: 3.37 MIL/uL — AB (ref 3.87–5.11)
RDW: 15 % (ref 11.5–15.5)
WBC: 7.8 10*3/uL (ref 4.0–10.5)

## 2013-05-03 LAB — BIRTH TISSUE RECOVERY COLLECTION (PLACENTA DONATION)

## 2013-05-03 NOTE — Anesthesia Postprocedure Evaluation (Signed)
Anesthesia Post Note  Patient: Paige Love  Procedure(s) Performed: Procedure(s) (LRB): CESAREAN SECTION (N/A)  Anesthesia type: Spinal  Patient location: Mother/Baby  Post pain: Pain level controlled  Post assessment: Post-op Vital signs reviewed  Last Vitals:  Filed Vitals:   05/03/13 0353  BP: 98/59  Pulse: 81  Temp: 36.6 C  Resp: 18    Post vital signs: Reviewed  Level of consciousness: awake  Complications: No apparent anesthesia complications

## 2013-05-03 NOTE — Lactation Note (Signed)
This note was copied from the chart of Paige Love. Lactation Consultation Note  Patient Name: Paige Love ZOXWR'UToday's Date: 05/03/2013 Reason for consult: Initial assessment  Visited with Mom, baby at 5323 hrs old.  Mom speaks some [place].  Mom states feedings have been going well.  Offered assistance with latching, as baby was starting to root.  Unwrapped from blanket, and stripped him down to a diaper.  Explained to Mom the importance and benefit of skin to skin at the breast.  Watched Mom position baby in football hold, offered one more pillow to raise him up higher.  Mom has a large nipple, and pinching her areola close to nipple when latching.  Spent some time teaching how to hold breast further back on breast to facilitate a deeper areolar grasp.   Explained how baby could chew and suckle on her nipple, but not transfer sufficient colostrum.  Taught Mom to wait on a wide open gape of mouth before bringing baby onto breast.  Mom denied any discomfort with latch.  Encouraged skin to skin and cue based feedings. Brochure left with Mom.  Explained about IP and OP lactation services available to her.  Encouraged to call prn.  Follow up in am.  Consult Status Consult Status: Follow-up Date: 05/04/13 Follow-up type: In-patient    Judee ClaraCaroline E Tilmon Wisehart 05/03/2013, 3:29 PM

## 2013-05-03 NOTE — Progress Notes (Signed)
UR completed 

## 2013-05-03 NOTE — Addendum Note (Signed)
Addendum created 05/03/13 1620 by Algis GreenhouseLinda A Johnwesley Lederman, CRNA   Modules edited: Notes Section   Notes Section:  File: [id number]

## 2013-05-03 NOTE — Progress Notes (Signed)
Subjective: Postpartum Day 1: Cesarean Delivery Patient reports mild incisional pain, otherwise improved today, tolerating PO and + flatus. Has foley catheter in place, trial off today.   Objective: Vital signs in last 24 hours: Temp:  [97.8 F (36.6 C)-99.1 F (37.3 C)] 97.8 F (36.6 C) (04/10 0353) Pulse Rate:  [74-92] 81 (04/10 0353) Resp:  [14-20] 18 (04/10 0353) BP: (94-114)/(50-70) 98/59 mmHg (04/10 0353) SpO2:  [96 %-100 %] 98 % (04/10 0353) Weight:  [65.817 kg (145 lb 1.6 oz)] 65.817 kg (145 lb 1.6 oz) (04/09 1737)  Physical Exam:  General: alert and cooperative Lochia: appropriate Uterine Fundus: firm, U -1 Incision: no bleeding through dressing, appropriate tenderness, no hematoma or edema DVT Evaluation: No evidence of DVT seen on physical exam. Negative Homan's sign. No cords or calf tenderness. No significant calf/ankle edema.   Recent Labs  05/02/13 1300 05/03/13 0601  HGB 10.7* 7.8*  HCT 33.2* 25.1*    Assessment/Plan: Status post Cesarean section. Doing well postoperatively.  Continue current care - Remove foley Expect discharge to home tomorrow if improved. - Contraception - FOB vasectomy  Saralyn Pilarlexander Karamalegos 05/03/2013, 7:58 AM  Evaluation and management procedures were performed by Resident physician under my supervision/collaboration. Chart reviewed, patient examined by me and I agree with management and plan. Danae Orleanseirdre C Yaret Hush, CNM 05/03/2013 10:08 AM

## 2013-05-04 MED ORDER — OXYCODONE-ACETAMINOPHEN 5-325 MG PO TABS: 1.0000 | ORAL_TABLET | ORAL | Status: DC | PRN

## 2013-05-04 MED ORDER — IBUPROFEN 600 MG PO TABS: 600.0000 mg | ORAL_TABLET | Freq: Four times a day (QID) | ORAL | Status: DC

## 2013-05-04 MED ORDER — DOCUSATE SODIUM 100 MG PO CAPS: 100.0000 mg | ORAL_CAPSULE | Freq: Two times a day (BID) | ORAL | Status: DC | PRN

## 2013-05-04 NOTE — Discharge Summary (Signed)
Attestation of Attending Supervision of Obstetric Fellow: Evaluation and management procedures were performed by the Obstetric Fellow under my supervision and collaboration.  I have reviewed the Obstetric Fellow's note and chart, and I agree with the management and plan.  Jaxston Chohan, MD, FACOG Attending Obstetrician & Gynecologist Faculty Practice, [facility]   

## 2013-05-04 NOTE — Discharge Instructions (Signed)
Cesarean Delivery °Care After °Refer to this sheet in the next few weeks. These instructions provide you with information on caring for yourself after your procedure. Your health care provider may also give you specific instructions. Your treatment has been planned according to current medical practices, but problems sometimes occur. Call your health care provider if you have any problems or questions after you go home. °HOME CARE INSTRUCTIONS  °· Only take over-the-counter or prescription medications as directed by your health care provider. °· Do not drink alcohol, especially if you are breastfeeding or taking medication to relieve pain. °· Do not chew or smoke tobacco. °· Continue to use good perineal care. Good perineal care includes: °· Wiping your perineum from front to back. °· Keeping your perineum clean. °· Check your surgical cut (incision) daily for increased redness, drainage, swelling, or separation of skin. °· Clean your incision gently with soap and water every day, and then pat it dry. If your health care provider says it is OK, leave the incision uncovered. Use a bandage (dressing) if the incision is draining fluid or appears irritated. If the adhesive strips across the incision do not fall off within 7 days, carefully peel them off. °· Hug a pillow when coughing or sneezing until your incision is healed. This helps to relieve pain. °· Do not use tampons or douche until your health care provider says it is okay. °· Shower, wash your hair, and take tub baths as directed by your health care provider. °· Wear a well-fitting bra that provides breast support. °· Limit wearing support panties or control-top hose. °· Drink enough fluids to keep your urine clear or pale yellow. °· Eat high-fiber foods such as whole grain cereals and breads, brown rice, beans, and fresh fruits and vegetables every day. These foods may help prevent or relieve constipation. °· Resume activities such as climbing stairs,  driving, lifting, exercising, or traveling as directed by your health care provider. °· Talk to your health care provider about resuming sexual activities. This is dependent upon your risk of infection, your rate of healing, and your comfort and desire to resume sexual activity. °· Try to have someone help you with your household activities and your newborn for at least a few days after you leave the hospital. °· Rest as much as possible. Try to rest or take a nap when your newborn is sleeping. °· Increase your activities gradually. °· Keep all of your scheduled postpartum appointments. It is very important to keep your scheduled follow-up appointments. At these appointments, your health care provider will be checking to make sure that you are healing physically and emotionally. °SEEK MEDICAL CARE IF:  °· You are passing large clots from your vagina. Save any clots to show your health care provider. °· You have a foul smelling discharge from your vagina. °· You have trouble urinating. °· You are urinating frequently. °· You have pain when you urinate. °· You have a change in your bowel movements. °· You have increasing redness, pain, or swelling near your incision. °· You have pus draining from your incision. °· Your incision is separating. °· You have painful, hard, or reddened breasts. °· You have a severe headache. °· You have blurred vision or see spots. °· You feel sad or depressed. °· You have thoughts of hurting yourself or your newborn. °· You have questions about your care, the care of your newborn, or medications. °· You are dizzy or lightheaded. °· You have a rash. °· You   have pain, redness, or swelling at the site of the removed intravenous access (IV) tube. °· You have nausea or vomiting. °· You stopped breastfeeding and have not had a menstrual period within 12 weeks of stopping. °· You are not breastfeeding and have not had a menstrual period within 12 weeks of delivery. °· You have a fever. °SEEK  IMMEDIATE MEDICAL CARE IF: °· You have persistent pain. °· You have chest pain. °· You have shortness of breath. °· You faint. °· You have leg pain. °· You have stomach pain. °· Your vaginal bleeding saturates 2 or more sanitary pads in 1 hour. °MAKE SURE YOU:  °· Understand these instructions. °· Will watch your condition. °· Will get help right away if you are not doing well or get worse. °Document Released: 10/02/2001 Document Revised: 09/12/2012 Document Reviewed: 09/07/2011 °Exit[employer]® Patient Information ©2014 [employer]. ° ° ° °

## 2013-05-04 NOTE — Discharge Summary (Signed)
Obstetric Discharge Summary Reason for Admission: cesarean section Prenatal Procedures: none Intrapartum Procedures: cesarean: low cervical, transverse Postpartum Procedures: none Complications-Operative and Postpartum: none Hemoglobin  Date Value Ref Range Status  05/03/2013 7.8* 12.0 - 15.0 g/dL Final     DELTA CHECK NOTED     REPEATED TO VERIFY     HCT  Date Value Ref Range Status  05/03/2013 25.1* 36.0 - 46.0 % Final    Physical Exam:  General: alert, cooperative and no distress Lochia: appropriate Uterine Fundus: firm Incision: healing well, no significant drainage, no significant erythema DVT Evaluation: No cords or calf tenderness. No significant calf/ankle edema.  Discharge Diagnoses: Term Pregnancy-delivered  Discharge Information: Date: 05/04/2013 Activity: pelvic rest Diet: routine Medications: PNV, Ibuprofen, Colace and Percocet Condition: stable Instructions: refer to practice specific booklet Discharge to: home Follow-up Information   [facility] Schedule an appointment as soon as possible for a visit in 5 weeks.   Specialty:  Obstetrics and Gynecology   Contact information:   [address] [place] [id number] [phone]      Newborn Data: Live born female  Birth Weight: 7 lb 2.5 oz (3246 g) APGAR: 9, 9  Home with mother.  Patient presented for elective repeat c/s.  C/s was performed without difficulty and no complications noted. Her postpartum course was uncomplicated but for a drop in hgb to 7.8 post surgery but asymptomatic.  She is breast feeding and her husband is getting a vasectomy for contraception.   Isael Stille L Keatin Benham 05/04/2013, 9:04 AM

## 2013-06-07 ENCOUNTER — Encounter: Payer: Self-pay | Admitting: Nurse Practitioner

## 2013-06-07 ENCOUNTER — Ambulatory Visit (INDEPENDENT_AMBULATORY_CARE_PROVIDER_SITE_OTHER): Payer: Self-pay | Admitting: Nurse Practitioner

## 2013-06-07 VITALS — BP 105/70 | HR 77 | Temp 98.2°F | Wt 129.3 lb

## 2013-06-07 DIAGNOSIS — Z01419 Encounter for gynecological examination (general) (routine) without abnormal findings: Secondary | ICD-10-CM | POA: Insufficient documentation

## 2013-06-07 DIAGNOSIS — Z309 Encounter for contraceptive management, unspecified: Secondary | ICD-10-CM

## 2013-06-07 DIAGNOSIS — Z0000 Encounter for general adult medical examination without abnormal findings: Secondary | ICD-10-CM

## 2013-06-07 MED ORDER — HYDROCORTISONE ACETATE 25 MG RE SUPP: 25.0000 mg | Freq: Two times a day (BID) | RECTAL | Status: DC

## 2013-06-07 NOTE — Progress Notes (Signed)
Patient ID: Paige Love, female   DOB: [birth year], 29 y.o.   MRN: [id number] Subjective:     Paige Love is a 28 y.o. female who presents for a postpartum visit. She is 4 week postpartum following a low cervical transverse Cesarean section. I have fully reviewed the prenatal and intrapartum course. The delivery was at 38 gestational weeks. Outcome: repeat cesarean section, low transverse incision. Anesthesia: epidural. Postpartum course has been uneventful. Baby's course has been uneventful. Baby is feeding by breast. Bleeding no bleeding. Bowel function is little constipated and has hemmorroids Bladder function is normal. Patient is not sexually active. Contraception method is vasectomy. Postpartum depression screening: negative.  The following portions of the patient's history were reviewed and updated as appropriate: past family history, past medical history, past social history, past surgical history and problem list.  Review of Systems Pertinent items are noted in HPI.   Objective:    BP 105/70  Pulse 77  Temp(Src) 98.2 F (36.8 C) (Oral)  Wt 129 lb 4.8 oz (58.65 kg)  Breastfeeding? Yes  General:  alert   Breasts:  inspection negative, no nipple discharge or bleeding, no masses or nodularity palpable  Lungs: clear to auscultation bilaterally  Heart:  regular rate and rhythm, S1, S2 normal, no murmur, click, rub or gallop  Abdomen: soft, non-tender; bowel sounds normal; no masses,  no organomegaly c/s healing well. No drainage or signs of infection   Vulva:  not evaluated  Vagina: not evaluated  Cervix:  not examined  Corpus: not examined  Adnexa:  not evaluated  Rectal Exam: Not performed.        Assessment:     postpartum exam. Pap smear not done at today's visit.   Plan:    1. Contraception: vasectomy/ husband has an appointment / will use condoms until safe 2. Hemorrhoids/ Anusol HC suppositories Constipation/ MOM/ diet/ water 3. Follow up in: 1  year or as needed.

## 2013-06-07 NOTE — Progress Notes (Signed)
Pt. Here for PP visit. Delivered via C-sec. Pt.'s husband to get a vasectomy.

## 2013-06-07 NOTE — Patient Instructions (Signed)
Postpartum Care After Cesarean Delivery °After you deliver your newborn (postpartum period), the usual stay in the hospital is 24 72 hours. If there were problems with your labor or delivery, or if you have other medical problems, you might be in the hospital longer.  °While you are in the hospital, you will receive help and instructions on how to care for yourself and your newborn during the postpartum period.  °While you are in the hospital: °· It is normal for you to have pain or discomfort from the incision in your abdomen. Be sure to tell your nurses when you are having pain, where the pain is located, and what makes the pain worse. °· If you are breastfeeding, you may feel uncomfortable contractions of your uterus for a couple of weeks. This is normal. The contractions help your uterus get back to normal size. °· It is normal to have some bleeding after delivery. °· For the first 1 3 days after delivery, the flow is red and the amount may be similar to a period. °· It is common for the flow to start and stop. °· In the first few days, you may pass some small clots. Let your nurses know if you begin to pass large clots or your flow increases. °· Do not  flush blood clots down the toilet before having the nurse look at them. °· During the next 3 10 days after delivery, your flow should become more watery and pink or brown-tinged in color. °· Ten to fourteen days after delivery, your flow should be a small amount of yellowish-white discharge. °· The amount of your flow will decrease over the first few weeks after delivery. Your flow may stop in 6 8 weeks. Most women have had their flow stop by 12 weeks after delivery. °· You should change your sanitary pads frequently. °· Wash your hands thoroughly with soap and water for at least 20 seconds after changing pads, using the toilet, or before holding or feeding your newborn. °· Your intravenous (IV) tubing will be removed when you are drinking enough fluids. °· The  urine drainage tube (urinary catheter) that was inserted before delivery may be removed within 6 8 hours after delivery or when feeling returns to your legs. You should feel like you need to empty your bladder within the first 6 8 hours after the catheter has been removed. °· In case you become weak, lightheaded, or faint, call your nurse before you get out of bed for the first time and before you take a shower for the first time. °· Within the first few days after delivery, your breasts may begin to feel tender and full. This is called engorgement. Breast tenderness usually goes away within 48 72 hours after engorgement occurs. You may also notice milk leaking from your breasts. If you are not breastfeeding, do not stimulate your breasts. Breast stimulation can make your breasts produce more milk. °· Spending as much time as possible with your newborn is very important. During this time, you and your newborn can feel close and get to know each other. Having your newborn stay in your room (rooming in) will help to strengthen the bond with your newborn. It will give you time to get to know your newborn and become comfortable caring for your newborn. °· Your hormones change after delivery. Sometimes the hormone changes can temporarily cause you to feel sad or tearful. These feelings should not last more than a few days. If these feelings last longer   than that, you should talk to your caregiver. °· If desired, talk to your caregiver about methods of family planning or contraception. °· Talk to your caregiver about immunizations. Your caregiver may want you to have the following immunizations before leaving the hospital: °· Tetanus, diphtheria, and pertussis (Tdap) or tetanus and diphtheria (Td) immunization. It is very important that you and your family (including grandparents) or others caring for your newborn are up-to-date with the Tdap or Td immunizations. The Tdap or Td immunization can help protect your newborn  from getting ill. °· Rubella immunization. °· Varicella (chickenpox) immunization. °· Influenza immunization. You should receive this annual immunization if you did not receive the immunization during your pregnancy. °Document Released: 10/05/2011 Document Reviewed: 10/05/2011 °[employer]® Patient Information ©2014 [employer]. ° °

## 2013-07-26 ENCOUNTER — Encounter (HOSPITAL_COMMUNITY): Payer: Self-pay | Admitting: Emergency Medicine

## 2013-07-26 ENCOUNTER — Emergency Department (HOSPITAL_COMMUNITY)
Admission: EM | Admit: 2013-07-26 | Discharge: 2013-07-26 | Disposition: A | Discharge status: Home / Self Care | Payer: Self-pay | Attending: Emergency Medicine | Admitting: Emergency Medicine

## 2013-07-26 DIAGNOSIS — Z79899 Other long term (current) drug therapy: Secondary | ICD-10-CM | POA: Insufficient documentation

## 2013-07-26 DIAGNOSIS — R509 Fever, unspecified: Secondary | ICD-10-CM | POA: Insufficient documentation

## 2013-07-26 DIAGNOSIS — R52 Pain, unspecified: Secondary | ICD-10-CM | POA: Insufficient documentation

## 2013-07-26 DIAGNOSIS — Z91199 Patient's noncompliance with other medical treatment and regimen due to unspecified reason: Secondary | ICD-10-CM | POA: Insufficient documentation

## 2013-07-26 DIAGNOSIS — IMO0002 Reserved for concepts with insufficient information to code with codable children: Secondary | ICD-10-CM | POA: Insufficient documentation

## 2013-07-26 DIAGNOSIS — Z87891 Personal history of nicotine dependence: Secondary | ICD-10-CM | POA: Insufficient documentation

## 2013-07-26 DIAGNOSIS — Z9119 Patient's noncompliance with other medical treatment and regimen: Secondary | ICD-10-CM | POA: Insufficient documentation

## 2013-07-26 LAB — URINALYSIS, ROUTINE W REFLEX MICROSCOPIC
Bilirubin Urine: NEGATIVE
Glucose, UA: NEGATIVE mg/dL
Hgb urine dipstick: NEGATIVE
Ketones, ur: 15 mg/dL — AB
LEUKOCYTES UA: NEGATIVE
Nitrite: NEGATIVE
Protein, ur: NEGATIVE mg/dL
Specific Gravity, Urine: 1.007 (ref 1.005–1.030)
Urobilinogen, UA: 0.2 mg/dL (ref 0.0–1.0)
pH: 6 (ref 5.0–8.0)

## 2013-07-26 NOTE — ED Notes (Signed)
Patient sates that her pain level is 1.

## 2013-07-26 NOTE — Discharge Instructions (Signed)
Viral Infections °A viral infection can be caused by different types of viruses. Most viral infections are not serious and resolve on their own. However, some infections may cause severe symptoms and may lead to further complications. °SYMPTOMS °Viruses can frequently cause: °· Minor sore throat. °· Aches and pains. °· Headaches. °· Runny nose. °· Different types of rashes. °· Watery eyes. °· Tiredness. °· Cough. °· Loss of appetite. °· Gastrointestinal infections, resulting in nausea, vomiting, and diarrhea. °These symptoms do not respond to antibiotics because the infection is not caused by bacteria. However, you might catch a bacterial infection following the viral infection. This is sometimes called a "superinfection." Symptoms of such a bacterial infection may include: °· Worsening sore throat with pus and difficulty swallowing. °· Swollen neck glands. °· Chills and a high or persistent fever. °· Severe headache. °· Tenderness over the sinuses. °· Persistent overall ill feeling (malaise), muscle aches, and tiredness (fatigue). °· Persistent cough. °· Yellow, green, or brown mucus production with coughing. °HOME CARE INSTRUCTIONS  °· Only take over-the-counter or prescription medicines for pain, discomfort, diarrhea, or fever as directed by your caregiver. °· Drink enough water and fluids to keep your urine clear or pale yellow. Sports drinks can provide valuable electrolytes, sugars, and hydration. °· Get plenty of rest and maintain proper nutrition. Soups and broths with crackers or rice are fine. °SEEK IMMEDIATE MEDICAL CARE IF:  °· You have severe headaches, shortness of breath, chest pain, neck pain, or an unusual rash. °· You have uncontrolled vomiting, diarrhea, or you are unable to keep down fluids. °· You or your child has an oral temperature above 102° F (38.9° C), not controlled by medicine. °· Your baby is older than 3 months with a rectal temperature of 102° F (38.9° C) or higher. °· Your baby is 3  months old or younger with a rectal temperature of 100.4° F (38° C) or higher. °MAKE SURE YOU:  °· Understand these instructions. °· Will watch your condition. °· Will get help right away if you are not doing well or get worse. °Document Released: 10/20/2004 Document Revised: 04/04/2011 Document Reviewed: 05/17/2010 °ExitCare® Patient Information ©2015 [employer]. This information is not intended to replace advice given to you by your health care provider. Make sure you discuss any questions you have with your health care provider. ° ° °Emergency Department Resource Guide °1) Find a Doctor and Pay Out of Pocket °Although you won't have to find out who is covered by your insurance plan, it is a good idea to ask around and get recommendations. You will then need to call the office and see if the doctor you have chosen will accept you as a new patient and what types of options they offer for patients who are self-pay. Some doctors offer discounts or will set up payment plans for their patients who do not have insurance, but you will need to ask so you aren't surprised when you get to your appointment. ° °2) Contact Your Local Health Department °Not all health departments have doctors that can see patients for sick visits, but many do, so it is worth a call to see if yours does. If you don't know where your local health department is, you can check in your phone book. The CDC also has a tool to help you locate your state's health department, and many state websites also have listings of all of their local health departments. ° °3) Find a Walk-in Clinic °If your illness is not likely   to be very severe or complicated, you may want to try a walk in clinic. These are popping up all over the country in pharmacies, drugstores, and shopping centers. They're usually staffed by nurse practitioners or physician assistants that have been trained to treat common illnesses and complaints. They're usually fairly quick and  inexpensive. However, if you have serious medical issues or chronic medical problems, these are probably not your best option. ° °No Primary Care Doctor: °- Call [facility] at  832-8000 - they can help you locate a primary care doctor that  accepts your insurance, provides certain services, etc. °- Physician Referral Service- 1-800-533-3463 ° °Chronic Pain Problems: °Organization         Address  Phone   Notes  °[facility]  (336) 297-2271 Patients need to be referred by their primary care doctor.  ° °Medication Assistance: °Organization         Address  Phone   Notes  °[place] [facility] [address] °[place], [place] [address] (336) 641-8030 --Must be a resident of [place] °-- Must have NO insurance coverage whatsoever (no Medicaid/ Medicare, etc.) °-- The pt. MUST have a primary care doctor that directs their care regularly and follows them in the community °  °[facility]  (866) 331-1348   °[employer]  (888) 892-1162   ° °Agencies that provide inexpensive medical care: °Organization         Address  Phone   Notes  °[facility]  (336) 832-8035   °[facility]    (336) 832-7272   °[facility] [address] °[place], [place] [address] (336) 832-4777   °[facility] [address], °[place] (336) 271-4999   °[facility]    (336) 373-0678   °[facility]    (336) 272-1050   °[facility] ° [address], [place] Phone:  (336) 832-4444, Fax:  (336) 832-4440 Hours of Operation:  9 am - 6 pm, M-F.  Also accepts Medicaid/Medicare and self-pay.  °[facility] ° [address], [place] Phone: (336) 832-3150, Fax: (336) 832-3151. Hours of Operation:  8:30 am - 5:30 pm, M-F.  Also accepts Medicaid and self-pay.  °[facility] [place] [address], [place] Phone: (336) 878-6027   °[facility] [address], [place], [place] (336)723-1848, Ext. 123 Mondays & Thursdays: 7-9 AM.  First 15 patients are seen on a first come, first serve basis. °  ° °Medicaid-accepting [place] Providers: ° °Organization         Address  Phone   Notes  °[facility] [address], [place] (336) 641-2100 Also accepts self-pay patients.  °[facility] [address], [place] ° (336) 856-9996   °[facility] [address], [place] (336) 288-8857   °[facility] [address] [place] [address], [place] (336) 299-7000   °Veita Bland [address], [place]  ° (336) 373-1557 Only accepts [place] Access Medicaid patients after they have their name applied to their card.  ° °Self-Pay (no insurance) in [place]: ° °Organization         Address  Phone   Notes  °Sickle Cell Patients, [facility] [address], [place] (336) 832-1970   °[facility] [address], [place] (336) 832-4400   °[facility] [place] ° [address],  (336) 992-4800   °[facility]/Dr. Osei-Bonsu ° [address] [place] [address], [place] or [address], [place] (336) 841-8500 Phone number for both [place] and [place] locations is the same.  °[facility] [address], [place] (336) 299-0000   °[facility] [address] [place] [address], [place] or [address] (336) 852-7530 °(336) 878-2260   °[facility] [address], [place] (336) 350-1642, phone; (336) 294-5005, fax Sees patients 1st and 3rd Saturday of every month.  Must not qualify for public or private insurance (i.e. Medicaid, Medicare, [place] Health Choice, Veterans' Benefits) • Household income should be no more than 200% of the poverty level •The clinic cannot treat you if you are pregnant or  think you are pregnant • Sexually transmitted diseases are not treated at the clinic.  ° ° °Dental Care: °Organization         Address  Phone  Notes  °[facility] [address], [place] (336) 641-6152 Accepts children up to age 21 who are enrolled in Medicaid or [place] Health Choice; pregnant women with a Medicaid card; and children who have applied for Medicaid or [place] Health Choice, but were declined, whose parents can pay a reduced fee at time of service.  °[facility] [place]  [address], [place] (336) 641-7733 Accepts children up to age 21 who are enrolled in Medicaid or [place] Health Choice; pregnant women with a Medicaid card; and children who have applied for Medicaid or [place] Health Choice, but were declined, whose parents can pay a reduced fee at time of service.  °[facility] ° [address],  (336) 641-4533 Patients are seen by appointment only. Walk-ins are not accepted. [facility] will see patients 18 years of age and older. °Monday - Tuesday (8am-5pm) °Most Wednesdays (8:30-5pm) °$30 per visit, cash only  °[facility] ° [address], [place] (336) 641-4533 Patients are seen by appointment only. Walk-ins are not accepted. [facility] will see patients 18 years of age and older. °One Wednesday Evening (Monthly: Volunteer Based).  $30 per visit, cash only  °[facility]  (919) 537-3737 for adults; Children under age 4, call Graduate Pediatric Dentistry at (919) [phone]. Children aged 4-14, please call (919) [phone] to request a pediatric application. ° Dental services are provided in all areas of dental care including fillings, crowns and bridges, complete and partial dentures, implants, gum treatment, root canals, and extractions. Preventive care is also provided. Treatment is provided to both adults  and children. °Patients are selected via a lottery and there is often a waiting list. °  °[facility] [address], ° ° (336) 763-8833 [email or web address] °  °[facility] [address], [place], [place] (336)723-1848, Ext. 123 Second and Fourth Thursday of each month, opens at 6:30 AM; Clinic ends at 9 AM.  Patients are seen on a first-come first-served basis, and a limited number are seen during each clinic.  ° °[facility] ° [address], [place], [place] (336) 723-7904   Eligibility Requirements °You must have lived in [place], [place], or [place] counties for at least the last three months. °    You cannot be eligible for state or federal sponsored healthcare insurance, including Veterans Administration, Medicaid, or Medicare. °  You generally cannot be eligible for healthcare insurance through your employer.  °  How to apply: °Eligibility screenings are held every Tuesday and Wednesday afternoon from 1:00 pm until 4:00 pm. You do not need an appointment for the interview!  °[facility] [address], [place], [place] 336-631-2330   °[facility]  336-342-8273   °[facility]  336-703-3100   °[facility]  336-570-6415   ° °Behavioral Health Resources in the Community: °Intensive Outpatient Programs °Organization         Address  Phone  Notes  °[facility] [address], [place], [place] 336-878-6098   °[facility] [address], [place], [place] 336-832-9800   °[facility] [address], [place], [place] ° 336-882-2125   °[facility] [address],  °[place], [place] 1-800-853-5163 or 336-641-4981   °Substance Abuse Resources °Organization         Address  Phone  Notes  °[facility]  336-882-2125   °[facility]  336-784-9470   °[facility]  336-285-9073     °[facility]  336-845-3988   °Residential & Outpatient Substance Abuse Program  1-800-659-3381   °Psychological Services °Organization         Address  Phone  Notes  ° Health  336- 832-9600   °[facility]  336- 378-7881   °[facility] [address], [place] 1-800-853-5163 or 336-641-4981   ° °Mobile Crisis Teams °Organization         Address  Phone  Notes  °[facility], Mobile Crisis Care Unit  1-877-626-1772   °Assertive °[facility] ° [address] [place], [place] 336-834-9664   °Sharon DeEsch [address] °[place] 336-554-5454   ° °Self-Help/Support Groups °Organization         Address  Phone             Notes  °[facility] - variety of support groups  336- 373-1402 Call for more information  °Narcotics Anonymous (NA), [facility] [address], °[place]  2 meetings at this location  ° °Residential Treatment Programs °Organization         Address  Phone  Notes  °[facility] [address],    °[place]  1-866-801-8205   °[facility] ° [address], [place], [place] 704-293-8524   °[facility] [address], [place] 336-845-3988 Admissions: 8am-3pm M-F  °[facility] [address]    °[place], [place] 336-841-1104   °[facility] [address], [place], [place] 336-379-7146   °[facility] [address]  °[place], [place] 336-285-9073   °[facility] - Intensive Outpatient [address], [place], [place] 336-852-3033   °[facility] [address]  °[place], [place] 1-877-615-2722 or 336-784-9470   °[facility] [address] [place], [place] 336-227-7417 Accepts Medicaid  °[facility] [address]  °[place]  1-800-659-3381 Substance Abuse/Addiction Treatment  ° °[place]  Behavioral Health Resources °Organization         Address  Phone  Notes  °[facility]  (888) 581-9988   °Julie Brannon, PhD [address] [place], [place]   (336) 349-5553 or (336) 951-0000   °[facility]   [address] °[place], [place] (336) 349-4454   °[facility] [address], [place], [place] (336) 342-8316 Insurance/Medicaid/sponsorship through [facility]  °[facility] [address]                                    [place], [place] (336) 342-8316 Therapy/tele-psych/case  °[facility] [address]  ° [place], [place] (336) 349-2233    °Dr. Arfeen  (336) 349-4544   °[facility] [place]  [employer] [place] [facility] 1) [address], [place] °2) [address], [place] °3)  [address], [place] (336) 349-3220 °(336) 342-7768 ° °(336) 342-8140   °[place] [facility] (336) 342-1394 or (336) 342-3537 (After Hours)    ° ° ° °

## 2013-07-26 NOTE — ED Provider Notes (Signed)
CSN: [id number]     Arrival date & time 07/26/13  1138 History   First MD Initiated Contact with Patient 07/26/13 1212     Chief Complaint  Patient presents with  . Back Pain  . Generalized Body Aches  . Fever     (Consider location/radiation/quality/duration/timing/severity/associated sxs/prior Treatment) HPI  28 year old female with no significant past medical history presents for evaluations of myalgias. Patient she recently gave birth in April 2 to a healthy girl. She is currently breast-feeding. She reports the past few months she developed mastitis in 2 separate occasion and during that time she develops generalized body aches fever and chills. However for the past 2 days she has been experiencing generalized body aches, fever as high as 104 yesterday, chills, but without any signs of mastitis. Endorse mild headache, pressure behind eyes, and my urinary discomfort for the same duration. Denies any significant vision changes, runny nose, sneezing, cough, ear pain, sore throat, chest pain, shortness of breath, abdominal pain, nausea vomiting diarrhea, or rash. She did take some Advil this morning. No recent sick contact.  Past Medical History  Diagnosis Date  . Medical history non-contributory    Past Surgical History  Procedure Laterality Date  . Cesarean section    . Dilation and curettage of uterus    . Cesarean section N/A 05/02/2013    Procedure: CESAREAN SECTION;  Surgeon: Levie HeritageJacob J Stinson, DO;  Location: [facility];  Service: Obstetrics;  Laterality: N/A;   Family History  Problem Relation Age of Onset  . Heart disease Mother   . Kidney disease Mother   . Thyroid disease Mother   . Hypertension Father    History  Substance Use Topics  . Smoking status: Former Smoker    Quit date: 08/20/2012  . Smokeless tobacco: Never Used  . Alcohol Use: No   OB History   Grav Para Term Preterm Abortions TAB SAB Ect Mult Living   5 2 2  3  3   2      Review of Systems  All other  systems reviewed and are negative.     Allergies  Review of patient's allergies indicates no known allergies.  Home Medications   Prior to Admission medications   Medication Sig Start Date End Date Taking? Authorizing Provider  docusate sodium (COLACE) 100 MG capsule Take 1 capsule (100 mg total) by mouth 2 (two) times daily as needed. 05/04/13   Vale HavenKeli L Beck, MD  hydrocortisone (ANUSOL-HC) 25 MG suppository Place 1 suppository (25 mg total) rectally 2 (two) times daily. 06/07/13   Delbert PhenixLinda M Barefoot, NP  ibuprofen (ADVIL,MOTRIN) 600 MG tablet Take 1 tablet (600 mg total) by mouth every 6 (six) hours. 05/04/13   Vale HavenKeli L Beck, MD  oxyCODONE-acetaminophen (PERCOCET/ROXICET) 5-325 MG per tablet Take 1-2 tablets by mouth every 4 (four) hours as needed for severe pain (moderate - severe pain). 05/04/13   Vale HavenKeli L Beck, MD  Prenatal Vit-Fe Fumarate-FA (PRENATAL MULTIVITAMIN) TABS tablet Take 1 tablet by mouth daily at 12 noon.    Historical Provider, MD   BP 105/70  Pulse 93  Temp(Src) 98.3 F (36.8 C) (Oral)  Resp 17  SpO2 99%  LMP 07/12/2013 Physical Exam  Nursing note and vitals reviewed. Constitutional: She is oriented to person, place, and time. She appears well-developed and well-nourished. No distress.  HENT:  Head: Atraumatic.  Right Ear: External ear normal.  Left Ear: External ear normal.  Nose: Nose normal.  Mouth/Throat: Oropharynx is clear and moist.  Acne skin changes noted to face, appears noninfected.  Eyes: Conjunctivae and EOM are normal. Pupils are equal, round, and reactive to light.  Neck: Neck supple.  No nuchal rigidity  Cardiovascular: Normal rate and regular rhythm.   Pulmonary/Chest: Effort normal and breath sounds normal.  Chaperone present:  No appreciable signs of infection or mastitis on breast examination.    Abdominal: Soft. There is no tenderness.  Genitourinary:  No CVA tenderness  Musculoskeletal: She exhibits no edema and no tenderness (no midline  spine tenderness).  Neurological: She is alert and oriented to person, place, and time.  Skin: No rash noted.  Psychiatric: She has a normal mood and affect.    ED Course  Procedures (including critical care time)  Patient here with generalized body aches, fever and chills. Patient appears nontoxic, afebrile with stable normal vital sign. No nuchal rigidity to suggest meningitis. No obvious URI symptoms. She does endorse mild dysuria, we'll check urine however she has no suprapubic tenderness or CVA tenderness.  1:28 PM UA without evidence of UTI.  Pt resting well, doubt acute emergent condition.  Will give resources and recommend sxs care for viral infection.  Return precaution discussed, especially if headache worsen with neck stiffness or other meningismal sign.    Labs Review Labs Reviewed  URINALYSIS, ROUTINE W REFLEX MICROSCOPIC - Abnormal; Notable for the following:    Ketones, ur 15 (*)    All other components within normal limits    Imaging Review No results found.   EKG Interpretation None      MDM   Final diagnoses:  Body aches    BP 105/70  Pulse 93  Temp(Src) 98.3 F (36.8 C) (Oral)  Resp 17  SpO2 99%  LMP 07/12/2013      Fayrene HelperBowie Toree Edling, PA-C 07/26/13 1329

## 2013-07-26 NOTE — ED Notes (Signed)
Pt. Stated, I have pain in body parts but not in my breast, like before. I've also had a fever

## 2013-07-27 NOTE — ED Provider Notes (Signed)
Medical screening examination/treatment/procedure(s) were conducted as a shared visit with non-physician practitioner(s) and myself.  I personally evaluated the patient during the encounter.   EKG Interpretation None      I interviewed and examined the patient. Lungs are CTAB. Cardiac exam wnl. Abdomen soft.  No UTI. Likely viral syndrome.    Junius ArgyleForrest S Berry Gallacher, MD 07/27/13 1149

## 2013-11-25 ENCOUNTER — Encounter (HOSPITAL_COMMUNITY): Payer: Self-pay | Admitting: Emergency Medicine

## 2014-11-25 IMAGING — US US OB COMP LESS 14 WK
1 series · 14 of 28 positions shown · non-contrast
Comparison: None.

CLINICAL DATA: bleeding early preg, r/o ectopic,bleeding, r/o
ectopic; ;

OBSTETRIC <14 WK US AND TRANSVAGINAL OB US
TECHNIQUE: Both transabdominal and transvaginal ultrasound
examinations were performed for complete evaluation of the
gestation as well as the maternal uterus, adnexal regions, and
pelvic cul-de-sac.

[Series 1: us ob transvaginal · 33 acquisitions, 14 frames shown]
[im 2/33]
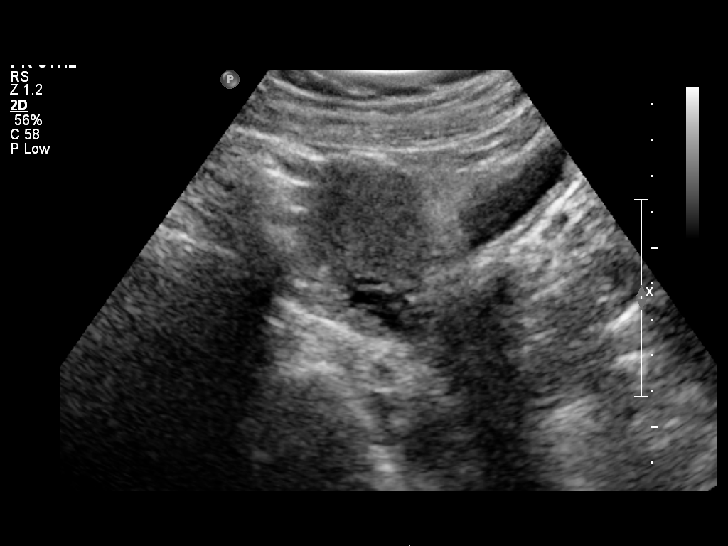
[im 4/33]
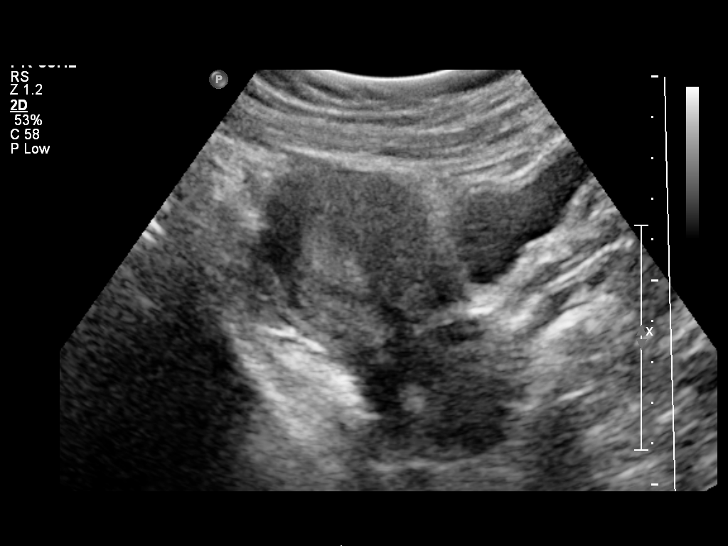
[im 6/33]
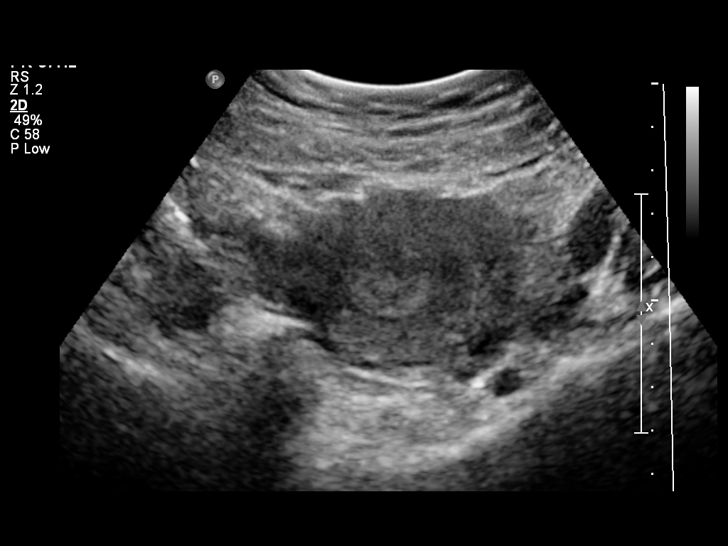
[im 9/33]
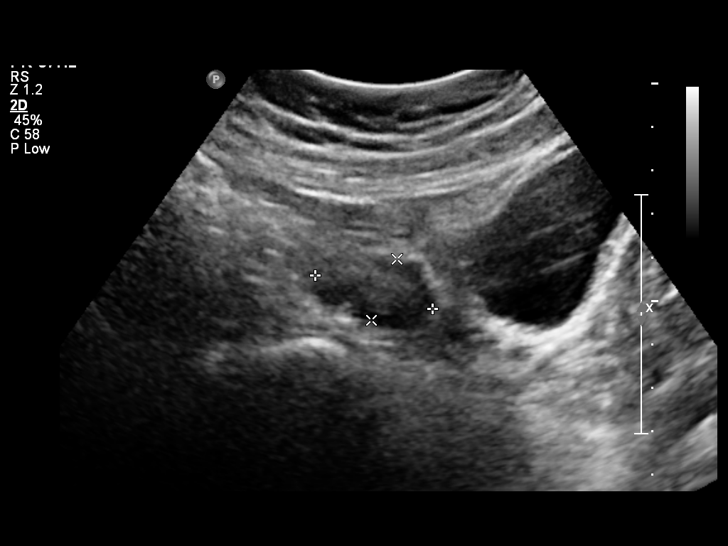
[im 11/33]
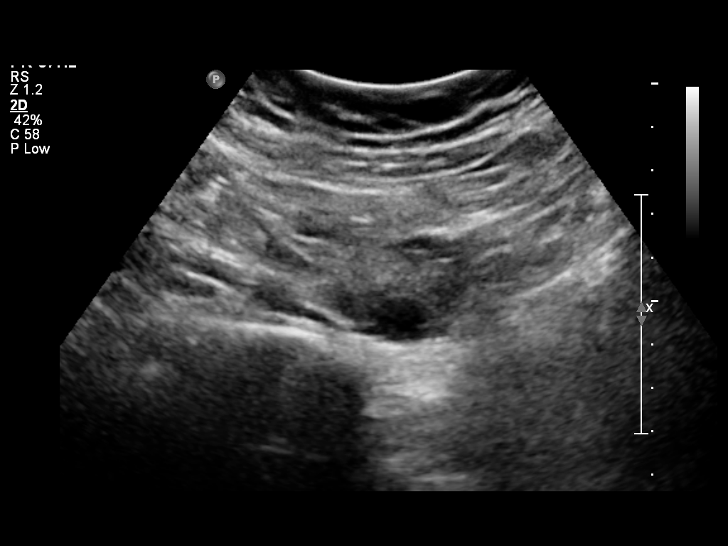
[im 14/33]
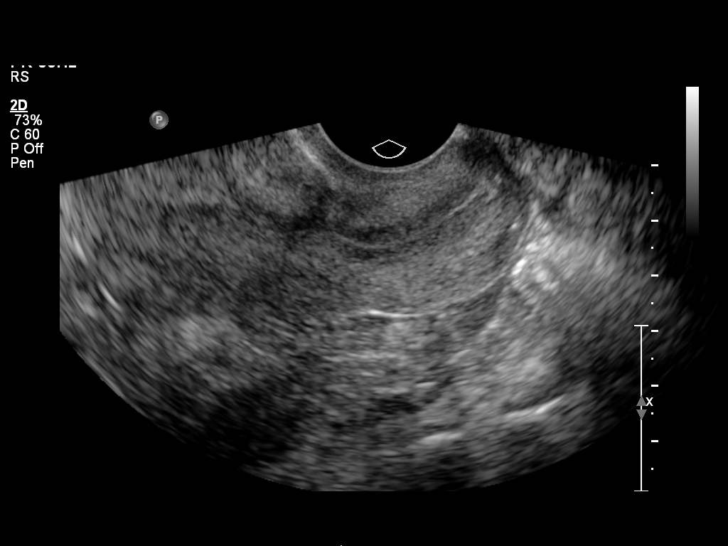
[im 16/33]
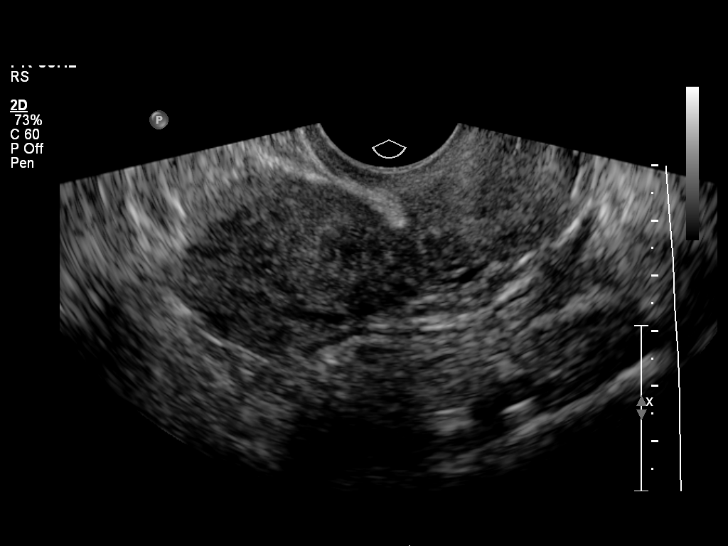
[im 18/33]
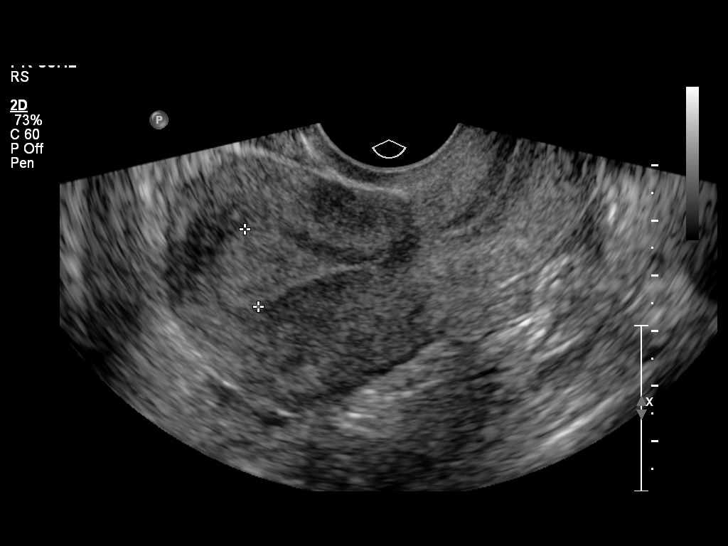
[im 21/33]
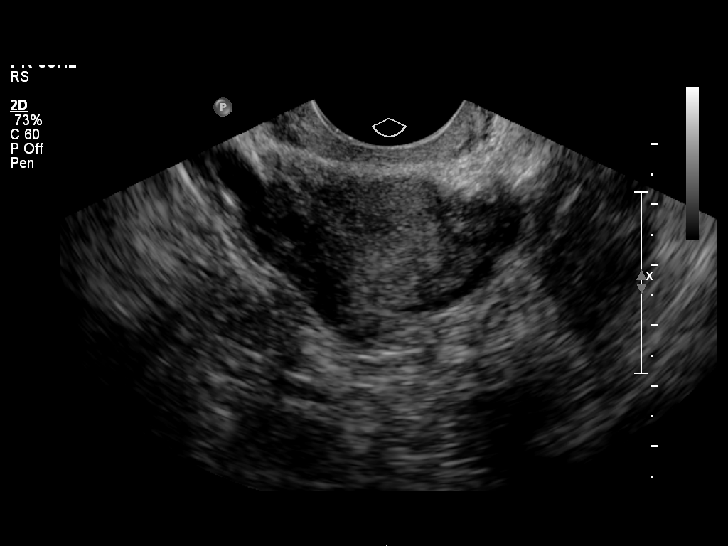
[im 23/33]
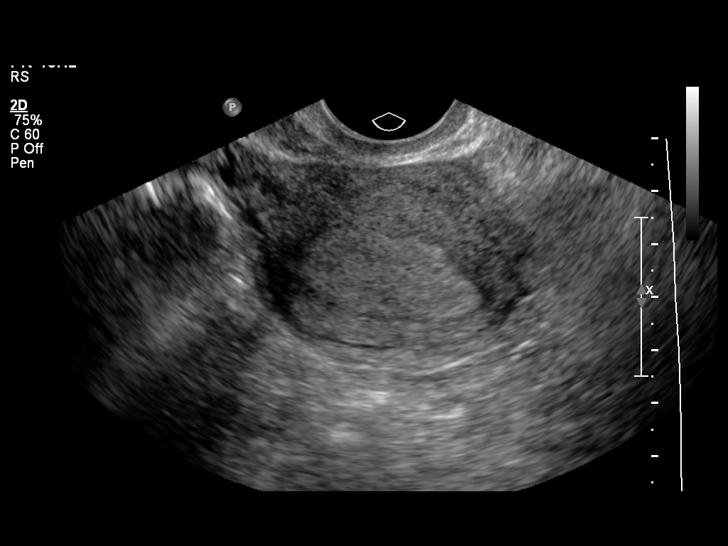
[im 25/33]
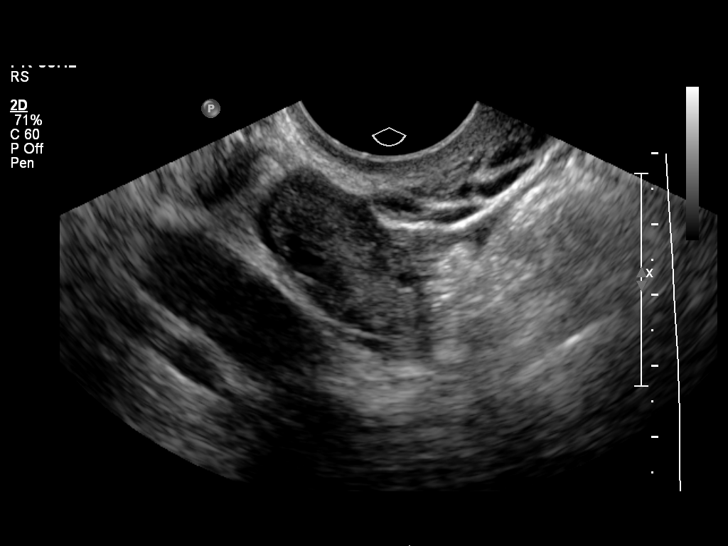
[im 28/33]
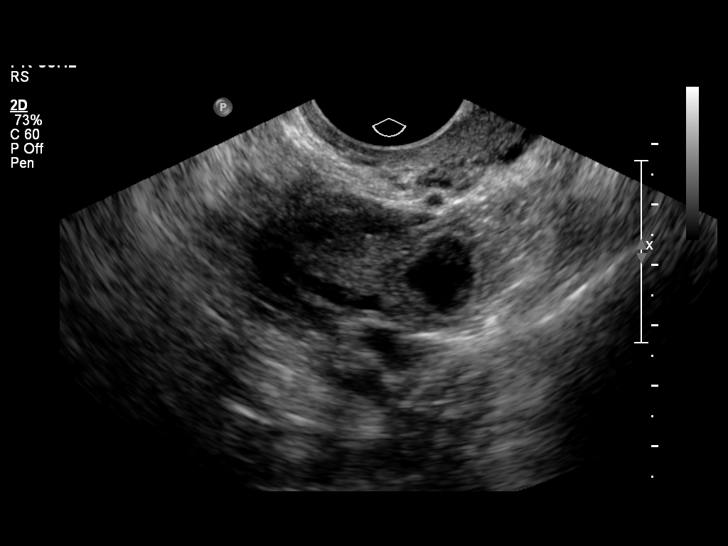
[im 30/33]
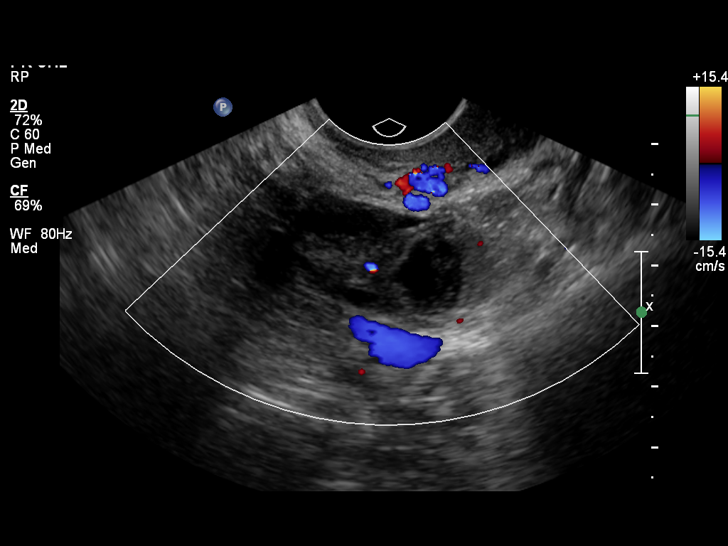
[im 33/33]
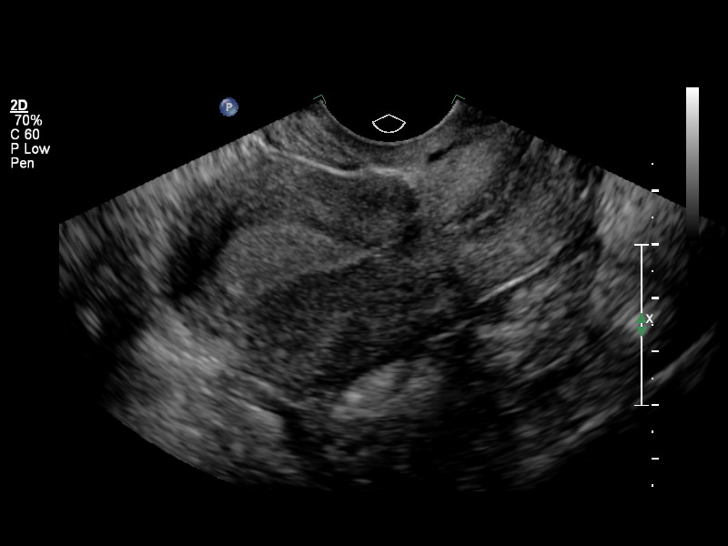

[14 of 28 positions shown; findings below may reference images not displayed]

FINDINGS: No intrauterine gestational sac is visualized.  Uterus is
unremarkable.  Endometrium 14 mm in thickness and homogeneous.

Ovaries are symmetric in size and echotexture.  Small left corpus
luteal cyst.  No adnexal masses.  No free fluid.
IMPRESSION: No intrauterine gestation.  Differential considerations would
include intrauterine gestation too early to visualize, spontaneous
abortion, or occult ectopic pregnancy.  Recommend continued close
follow-up with serial quantitative HCGs and ultrasounds.

## 2018-09-18 ENCOUNTER — Other Ambulatory Visit: Payer: Self-pay

## 2018-09-18 DIAGNOSIS — Z20822 Contact with and (suspected) exposure to covid-19: Secondary | ICD-10-CM

## 2018-09-20 ENCOUNTER — Telehealth: Payer: Self-pay | Admitting: *Deleted

## 2018-09-20 LAB — NOVEL CORONAVIRUS, NAA: SARS-CoV-2, NAA: NOT DETECTED
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
## 2020-05-04 ENCOUNTER — Other Ambulatory Visit: Payer: Self-pay

## 2020-05-04 ENCOUNTER — Emergency Department (HOSPITAL_COMMUNITY)
Admission: EM | Admit: 2020-05-04 | Discharge: 2020-05-04 | Disposition: A | Discharge status: Home / Self Care | Payer: Self-pay | Attending: Emergency Medicine | Admitting: Emergency Medicine

## 2020-05-04 ENCOUNTER — Emergency Department (HOSPITAL_COMMUNITY): Payer: Self-pay

## 2020-05-04 ENCOUNTER — Encounter (HOSPITAL_COMMUNITY): Payer: Self-pay

## 2020-05-04 DIAGNOSIS — Y92009 Unspecified place in unspecified non-institutional (private) residence as the place of occurrence of the external cause: Secondary | ICD-10-CM | POA: Insufficient documentation

## 2020-05-04 DIAGNOSIS — S0003XA Contusion of scalp, initial encounter: Secondary | ICD-10-CM | POA: Insufficient documentation

## 2020-05-04 DIAGNOSIS — Z87891 Personal history of nicotine dependence: Secondary | ICD-10-CM | POA: Insufficient documentation

## 2020-05-04 DIAGNOSIS — S0990XA Unspecified injury of head, initial encounter: Secondary | ICD-10-CM

## 2020-05-04 LAB — I-STAT CHEM 8, ED
BUN: 7 mg/dL (ref 6–20)
Calcium, Ion: 1.14 mmol/L — ABNORMAL LOW (ref 1.15–1.40)
Chloride: 107 mmol/L (ref 98–111)
Creatinine, Ser: 0.5 mg/dL (ref 0.44–1.00)
Glucose, Bld: 109 mg/dL — ABNORMAL HIGH (ref 70–99)
HCT: 41 % (ref 36.0–46.0)
Hemoglobin: 13.9 g/dL (ref 12.0–15.0)
Potassium: 4 mmol/L (ref 3.5–5.1)
Sodium: 144 mmol/L (ref 135–145)
TCO2: 23 mmol/L (ref 22–32)

## 2020-05-04 LAB — I-STAT BETA HCG BLOOD, ED (MC, WL, AP ONLY): I-stat hCG, quantitative: <5 m[IU]/mL (ref <5)

## 2020-05-04 MED ORDER — ACETAMINOPHEN 500 MG PO TABS
1000.0000 mg | ORAL_TABLET | Freq: Once | ORAL | Status: AC
  Administered 2020-05-04: 1000 mg via ORAL
  Filled 2020-05-04: qty 2

## 2020-05-04 MED ORDER — IOHEXOL 350 MG/ML SOLN
100.0000 mL | Freq: Once | INTRAVENOUS | Status: AC | PRN
  Administered 2020-05-04: 100 mL via INTRAVENOUS

## 2020-05-04 MED ORDER — ONDANSETRON 8 MG PO TBDP
8.0000 mg | ORAL_TABLET | Freq: Once | ORAL | Status: AC
  Administered 2020-05-04: 8 mg via ORAL
  Filled 2020-05-04: qty 1
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
## 2020-11-11 ENCOUNTER — Telehealth: Payer: Self-pay

## 2020-11-11 NOTE — Telephone Encounter (Addendum)
Via Delorise Royals, Spanish Interpreter [facility]), Patient contacted office and stated that she has Left breast discharge-milky, no pain, no lumps x 2 months, only occurs 1 week prior to menstrual cycle, occurs with expression/squeezing, feels pressure, fullness, then is better. LMP: 10/11/2020.  Patient also stated that her last pap was over 7 years ago. Patient informed needs appointment. Patient verbalized understanding, intake information completed, will be given to scheduler.

## 2020-11-16 ENCOUNTER — Other Ambulatory Visit: Payer: Self-pay

## 2020-11-16 DIAGNOSIS — N6452 Nipple discharge: Secondary | ICD-10-CM

## 2020-11-18 ENCOUNTER — Other Ambulatory Visit: Payer: Self-pay

## 2020-12-10 ENCOUNTER — Other Ambulatory Visit: Payer: Self-pay | Admitting: Obstetrics and Gynecology

## 2020-12-10 ENCOUNTER — Ambulatory Visit
Admission: RE | Admit: 2020-12-10 | Discharge: 2020-12-10 | Disposition: A | Discharge status: Home / Self Care | Payer: No Typology Code available for payment source | Source: Ambulatory Visit | Attending: Obstetrics and Gynecology | Admitting: Obstetrics and Gynecology

## 2020-12-10 ENCOUNTER — Other Ambulatory Visit: Payer: Self-pay

## 2020-12-10 ENCOUNTER — Ambulatory Visit
Admission: RE | Admit: 2020-12-10 | Discharge: 2020-12-10 | Disposition: A | Discharge status: Home / Self Care | Payer: Self-pay | Source: Ambulatory Visit | Attending: Obstetrics and Gynecology | Admitting: Obstetrics and Gynecology

## 2020-12-10 ENCOUNTER — Ambulatory Visit: Payer: Self-pay | Admitting: *Deleted

## 2020-12-10 VITALS — BP 116/76 | Wt 167.7 lb

## 2020-12-10 DIAGNOSIS — N6452 Nipple discharge: Secondary | ICD-10-CM

## 2020-12-10 DIAGNOSIS — Z01419 Encounter for gynecological examination (general) (routine) without abnormal findings: Secondary | ICD-10-CM

## 2020-12-10 DIAGNOSIS — N63 Unspecified lump in unspecified breast: Secondary | ICD-10-CM

## 2020-12-10 DIAGNOSIS — N898 Other specified noninflammatory disorders of vagina: Secondary | ICD-10-CM

## 2020-12-10 DIAGNOSIS — N644 Mastodynia: Secondary | ICD-10-CM

## 2020-12-10 NOTE — Progress Notes (Signed)
[name] is a 35 y.o. [phone] female who presents to [facility] clinic today with complaint of left breast milky discharge when expressed x one month. Patient complained of left outer and upper breast pain that comes and goes that feels like a burning sensation. Patient rates the pain at a 1 out of 10.    Pap Smear: Pap smear completed today. Last Pap smear was 10/23/2012 at [facility] for [facility] and was normal. Per patient has no history of an abnormal Pap smear. Last Pap smear result is available in Epic.   Physical exam: Breasts Right breast slightly larger than left breast that per patient is normal for her. No skin abnormalities bilateral breasts. No nipple retraction bilateral breasts. No nipple discharge bilateral breasts. Unable to express any nipple discharge on exam. No lymphadenopathy. No lumps palpated bilateral breasts. No complaints of pain or tenderness on exam.    Pelvic/Bimanual Ext Genitalia No lesions, no swelling and no discharge observed on external genitalia.        Vagina Vagina pink and normal texture. No lesions and thick white discharge observed in vagina. Wet prep completed.       Cervix Cervix is present. Cervix pink and of normal texture. Thick white discharge observed on cervix.   Uterus Uterus is present and palpable. Uterus in normal position and normal size.        Adnexae Bilateral ovaries present and palpable. No tenderness on palpation.         Rectovaginal No rectal exam completed today since patient had no rectal complaints. No skin abnormalities observed on exam.     Smoking History: Patient is a former smoker that quit in July 2022.   Patient Navigation: Patient education provided. Access to services provided for patient through [place] program. Spanish interpreter Paige Love from [facility] provided.    Breast and Cervical Cancer Risk Assessment: Patient does not have family history of breast cancer,  known genetic mutations, or radiation treatment to the chest before age 73. Patient does not have history of cervical dysplasia, immunocompromised, or DES exposure in-utero.  Risk Assessment     Risk Scores       12/10/2020   Last edited by: Paige Rutherford, LPN   5-year risk: 0.2 %   Lifetime risk: 6.5 %            A: B[facility]CP exam with pap smear Complaint of left breast discharge and pain.  P: Referred patient to the [facility] for a diagnostic mammogram. Appointment scheduled Thursday, December 10, 2020 at 1030.  Paige Heidelberg, RN 12/10/2020 8:45 AM

## 2020-12-10 NOTE — Patient Instructions (Signed)
Explained breast self awareness with Paige Limon Love. Pap smear completed today. Let her know B[facility]CP will cover Pap smears and HPV typing every 5 years unless has a history of abnormal Pap smears. Referred patient to the [facility] for a diagnostic mammogram. Appointment scheduled Thursday, December 10, 2020 at 1030. Patient aware of appointment and will be there. Let patient know will follow up with her within the next week with results of her wet prep and Pap smear by phone. Paige Love verbalized understanding.  Rehana Uncapher, Kathaleen Maser, RN 8:45 AM

## 2020-12-11 ENCOUNTER — Other Ambulatory Visit: Payer: Self-pay

## 2020-12-11 ENCOUNTER — Telehealth: Payer: Self-pay

## 2020-12-11 LAB — CERVICOVAGINAL ANCILLARY ONLY
Bacterial Vaginitis (gardnerella): POSITIVE — AB
Candida Glabrata: NEGATIVE
Candida Vaginitis: NEGATIVE
Comment: NEGATIVE
Comment: NEGATIVE
Comment: NEGATIVE
Comment: NEGATIVE
Trichomonas: NEGATIVE

## 2020-12-11 MED ORDER — METRONIDAZOLE 500 MG PO TABS: 500.0000 mg | ORAL_TABLET | Freq: Two times a day (BID) | ORAL | 0 refills | Status: AC

## 2020-12-11 NOTE — Telephone Encounter (Signed)
Via Nira Conn, Spanish Interpereter [facility]), Patient informed wet prep results positive for bacterial vaginitis, needs rx Flagyl sent to pharmacy, will call back with pap results. Patient verbalized understanding. Rx sent to pharmacy.

## 2020-12-14 LAB — CYTOLOGY - PAP
Adequacy: ABSENT
Comment: NEGATIVE
Diagnosis: NEGATIVE
High risk HPV: POSITIVE — AB

## 2020-12-16 ENCOUNTER — Telehealth: Payer: Self-pay

## 2020-12-16 NOTE — Telephone Encounter (Signed)
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 Patient verbalized understanding.

## 2021-06-10 ENCOUNTER — Ambulatory Visit
Admission: RE | Admit: 2021-06-10 | Discharge: 2021-06-10 | Disposition: A | Discharge status: Home / Self Care | Payer: No Typology Code available for payment source | Source: Ambulatory Visit | Attending: Obstetrics and Gynecology | Admitting: Obstetrics and Gynecology

## 2021-06-10 ENCOUNTER — Other Ambulatory Visit: Payer: Self-pay | Admitting: Obstetrics and Gynecology

## 2021-06-10 DIAGNOSIS — N63 Unspecified lump in unspecified breast: Secondary | ICD-10-CM

## 2021-06-10 DIAGNOSIS — N6310 Unspecified lump in the right breast, unspecified quadrant: Secondary | ICD-10-CM

## 2021-10-05 ENCOUNTER — Other Ambulatory Visit: Payer: Self-pay

## 2021-10-05 DIAGNOSIS — Z87898 Personal history of other specified conditions: Secondary | ICD-10-CM

## 2021-12-30 ENCOUNTER — Ambulatory Visit: Payer: Self-pay | Admitting: Hematology and Oncology

## 2021-12-30 ENCOUNTER — Ambulatory Visit
Admission: RE | Admit: 2021-12-30 | Discharge: 2021-12-30 | Disposition: A | Discharge status: Home / Self Care | Payer: No Typology Code available for payment source | Source: Ambulatory Visit | Attending: Obstetrics and Gynecology | Admitting: Obstetrics and Gynecology

## 2021-12-30 VITALS — BP 108/72 | Wt 157.0 lb

## 2021-12-30 DIAGNOSIS — Z87898 Personal history of other specified conditions: Secondary | ICD-10-CM

## 2021-12-30 DIAGNOSIS — Z1231 Encounter for screening mammogram for malignant neoplasm of breast: Secondary | ICD-10-CM

## 2021-12-30 DIAGNOSIS — Z124 Encounter for screening for malignant neoplasm of cervix: Secondary | ICD-10-CM

## 2021-12-30 NOTE — Patient Instructions (Signed)
Taught Paige Love about self breast awareness and gave educational materials to take home. Patient did need a Pap smear today due to last Pap smear was in 2022 with +HPV per patient. She will need Pap yearly x 3. Let her know B[facility]CP will cover Pap smears every 5 years unless has a history of abnormal Pap smears. Referred patient to the [facility] for diagnostic mammogram. Appointment scheduled for 12/30/21. Patient aware of appointment and will be there. Let patient know will follow up with her within the next couple weeks with results. Paige Love verbalized understanding.  Pascal Lux, NP 8:40 AM

## 2021-12-30 NOTE — Progress Notes (Signed)
[name] is a 36 y.o. [phone] female who presents to [facility] clinic today with no complaints . Follow up of probable benign right breast mass.    Pap Smear: Pap smear completed today. Last Pap smear was 2022 and was Negative/ HPV+ . Per patient has no history of an abnormal Pap smear. Last Pap smear result is available in Epic.   Physical exam: Breasts Breasts symmetrical. No skin abnormalities bilateral breasts. No nipple retraction bilateral breasts. No nipple discharge bilateral breasts. No lymphadenopathy. No lumps palpated bilateral breasts.     MS DIGITAL DIAG TOMO BILAT  Result Date: 12/10/2020 CLINICAL DATA:  The patient presented with non spontaneous milky left-sided nipple discharge. EXAM: DIGITAL DIAGNOSTIC BILATERAL MAMMOGRAM WITH TOMOSYNTHESIS AND CAD; ULTRASOUND RIGHT BREAST LIMITED; ULTRASOUND LEFT BREAST LIMITED TECHNIQUE: Bilateral digital diagnostic mammography and breast tomosynthesis was performed. The images were evaluated with computer-aided detection.; Targeted ultrasound examination of the right breast was performed; Targeted ultrasound examination of the left breast was performed. COMPARISON:  None. ACR Breast Density Category c: The breast tissue is heterogeneously dense, which may obscure small masses. FINDINGS: There is suggestion of a partially obscured mass in the inferior right breast. No other suspicious findings on the right. Possible calcifications behind the left nipple resolve on magnified views, representing artifact on C view imaging. No other suspicious mammographic findings in either breast. On physical exam, no suspicious lumps are identified. Targeted ultrasound is performed, showing no abnormalities in the left breast. There is a probably benign mass at 5 o'clock, 3 cm from the nipple on the right measuring 8 x 4 x 8 mm. IMPRESSION: Probably benign right breast mass seen on ultrasound, correlating with mammographic findings. No other suspicious  mammographic or sonographic abnormalities. RECOMMENDATION: The patient's nipple discharge is not typical for a papilloma or cancer. If her discharge changes in character, becoming spontaneous, bloody, or clear, recommend breast MRI. Otherwise, recommend clinical follow-up. Recommend six-month follow-up ultrasound of the probably benign right breast mass. I have discussed the findings and recommendations with the patient. If applicable, a reminder letter will be sent to the patient regarding the next appointment. BI-RADS CATEGORY  3: Probably benign. Electronically Signed   By: Dorise Bullion III M.D.   On: 12/10/2020 11:54     Pelvic/Bimanual Ext Genitalia No lesions, no swelling and no discharge observed on external genitalia.        Vagina Vagina pink and normal texture. No lesions or discharge observed in vagina.        Cervix Cervix is present. Cervix pink and of normal texture. No discharge observed.    Uterus Uterus is present and palpable. Uterus in normal position and normal size.        Adnexae Bilateral ovaries present and palpable. No tenderness on palpation.         Rectovaginal No rectal exam completed today since patient had no rectal complaints. No skin abnormalities observed on exam.     Smoking History: Patient has is a former smoker and was not referred to quit line.    Patient Navigation: Patient education provided. Access to services provided for patient through [place] interpreter provided. No transportation provided   Colorectal Cancer Screening: Per patient has never had colonoscopy completed No complaints today.    Breast and Cervical Cancer Risk Assessment: Patient does not have family history of breast cancer, known genetic mutations, or radiation treatment to the chest before age 27. Patient does not have history of cervical  dysplasia, immunocompromised, or DES exposure in-utero.  Risk Assessment   No risk assessment data for the  current encounter  Risk Scores       12/10/2020   Last edited by: Narda Rutherford, LPN   5-year risk: 0.2 %   Lifetime risk: 6.5 %            A: BCCCP exam with pap smear No complaints, continues to report nipple discharge when attempted to express. Radiology recommends MRI of the breast if discharge continues as it is not typical of malignancy. Benign exam today.   P: Referred patient to the [facility] for a diagnostic mammogram. Appointment scheduled 12/30/21.  Pascal Lux, NP 12/30/2021 8:39 AM

## 2022-01-05 LAB — CYTOLOGY - PAP
Adequacy: ABSENT
Comment: NEGATIVE
Comment: NEGATIVE
Comment: NEGATIVE
Diagnosis: NEGATIVE
HPV 16: NEGATIVE
HPV 18 / 45: NEGATIVE
High risk HPV: POSITIVE — AB

## 2022-01-11 ENCOUNTER — Telehealth: Payer: Self-pay

## 2022-01-11 NOTE — Telephone Encounter (Signed)
With [employer], [name], pt has been advised of her negative PAP test results. Pt has been advised to repeat the testing in three years.

## 2022-01-13 ENCOUNTER — Telehealth: Payer: Self-pay

## 2022-01-13 NOTE — Telephone Encounter (Signed)
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  Thanks! ----- Message ----- From: Interface, Lab In Three Zero One Sent: 01/05/2022   4:40 PM EST To: Pascal Lux, NP

## 2022-01-13 NOTE — Telephone Encounter (Signed)
With [facility] Interpreter, [name], pt has been advised of her negative PAP/+HPV results. She understands to repeat her PAP in one year.

## 2022-11-08 ENCOUNTER — Other Ambulatory Visit: Payer: Self-pay

## 2022-11-08 DIAGNOSIS — N6314 Unspecified lump in the right breast, lower inner quadrant: Secondary | ICD-10-CM

## 2023-01-12 ENCOUNTER — Ambulatory Visit
Admission: RE | Admit: 2023-01-12 | Discharge: 2023-01-12 | Disposition: A | Discharge status: Home / Self Care | Payer: No Typology Code available for payment source | Source: Ambulatory Visit | Attending: Obstetrics and Gynecology | Admitting: Obstetrics and Gynecology

## 2023-01-12 ENCOUNTER — Ambulatory Visit: Payer: Self-pay | Admitting: Hematology and Oncology

## 2023-01-12 VITALS — Wt 163.0 lb

## 2023-01-12 DIAGNOSIS — Z1231 Encounter for screening mammogram for malignant neoplasm of breast: Secondary | ICD-10-CM

## 2023-01-12 DIAGNOSIS — N6314 Unspecified lump in the right breast, lower inner quadrant: Secondary | ICD-10-CM

## 2023-01-12 DIAGNOSIS — Z124 Encounter for screening for malignant neoplasm of cervix: Secondary | ICD-10-CM

## 2023-01-12 NOTE — Progress Notes (Signed)
[name] is a 38 y.o. female who presents to [facility] clinic today with no complaints. Follow up right breast mass, likely benign.    Pap Smear: Pap not smear completed today. Last Pap smear was 12/30/2021 and was  Normal/ HPV+ . Per patient has history of an abnormal Pap smear. Last Pap smear result is available in Epic. 12/10/2020 - Normal/ HPV+; 10/23/2012 - Normal/ HPV-   Physical exam: Breasts Breasts symmetrical. No skin abnormalities bilateral breasts. No nipple retraction bilateral breasts. No nipple discharge bilateral breasts. No lymphadenopathy. No lumps palpated bilateral breasts.     MS DIGITAL DIAG TOMO BILAT Result Date: 12/10/2020 CLINICAL DATA:  The patient presented with non spontaneous milky left-sided nipple discharge. EXAM: DIGITAL DIAGNOSTIC BILATERAL MAMMOGRAM WITH TOMOSYNTHESIS AND CAD; ULTRASOUND RIGHT BREAST LIMITED; ULTRASOUND LEFT BREAST LIMITED TECHNIQUE: Bilateral digital diagnostic mammography and breast tomosynthesis was performed. The images were evaluated with computer-aided detection.; Targeted ultrasound examination of the right breast was performed; Targeted ultrasound examination of the left breast was performed. COMPARISON:  None. ACR Breast Density Category c: The breast tissue is heterogeneously dense, which may obscure small masses. FINDINGS: There is suggestion of a partially obscured mass in the inferior right breast. No other suspicious findings on the right. Possible calcifications behind the left nipple resolve on magnified views, representing artifact on C view imaging. No other suspicious mammographic findings in either breast. On physical exam, no suspicious lumps are identified. Targeted ultrasound is performed, showing no abnormalities in the left breast. There is a probably benign mass at 5 o'clock, 3 cm from the nipple on the right measuring 8 x 4 x 8 mm. IMPRESSION: Probably benign right breast mass seen on ultrasound, correlating with  mammographic findings. No other suspicious mammographic or sonographic abnormalities. RECOMMENDATION: The patient's nipple discharge is not typical for a papilloma or cancer. If her discharge changes in character, becoming spontaneous, bloody, or clear, recommend breast MRI. Otherwise, recommend clinical follow-up. Recommend six-month follow-up ultrasound of the probably benign right breast mass. I have discussed the findings and recommendations with the patient. If applicable, a reminder letter will be sent to the patient regarding the next appointment. BI-RADS CATEGORY  3: Probably benign. Electronically Signed   By: Gerome Sam III M.D.   On: 12/10/2020 11:54     Pelvic/Bimanual Pap is not indicated today    Smoking History: Patient has never smoked and was not referred to quit line.    Patient Navigation: Patient education provided. Access to services provided for patient through [facility] program. [name] interpreter provided. No transportation provided   Colorectal Cancer Screening: Per patient has never had colonoscopy completed No complaints today.    Breast and Cervical Cancer Risk Assessment: Patient does not have family history of breast cancer, known genetic mutations, or radiation treatment to the chest before age 1. Patient does not have history of cervical dysplasia, immunocompromised, or DES exposure in-utero.  Risk Scores as of Encounter on 01/12/2023     [name]           5-year 0.27%   Lifetime 7.36%            Last calculated by Caprice Red, CMA on 01/12/2023 at 10:53 AM        A: [facility] exam without pap smear No complaints with benign exam. Follow up probable benign right breast mass.   P: Referred patient to the [facility] for a diagnostic mammogram. Appointment scheduled 01/12/2023.  Pascal Lux,  NP 01/12/2023 11:06 AM

## 2023-01-12 NOTE — Patient Instructions (Signed)
Taught Paige Love how to perform BSE and gave educational materials to take home. Patient did not need a Pap smear today due to last Pap smear was in 12/ per patient. Told patient about free cervical cancer screenings to receive a Pap smear if would like one next year. Let her know [facility] will cover Pap smears every 5 years unless has a history of abnormal Pap smears. Referred patient to the [facility] for diagnostic mammogram. Appointment scheduled for 01/12/2023. Patient aware of appointment and will be there. Let patient know will follow up with her within the next couple weeks with results. Paige Love verbalized understanding.  Pascal Lux, NP 11:13 AM

## 2023-01-17 LAB — CYTOLOGY - PAP
Adequacy: ABSENT
Comment: NEGATIVE
Diagnosis: NEGATIVE
High risk HPV: NEGATIVE

## 2023-01-19 ENCOUNTER — Telehealth: Payer: Self-pay

## 2023-01-19 ENCOUNTER — Other Ambulatory Visit: Payer: Self-pay | Admitting: Hematology and Oncology

## 2023-01-19 MED ORDER — CLINDAMYCIN PHOSPHATE 2 % VA CREA: 1.0000 | TOPICAL_CREAM | Freq: Every day | VAGINAL | 0 refills | Status: AC

## 2023-01-19 NOTE — Telephone Encounter (Addendum)
Via Marlena Yemen, [facility] [employer], Patient informed negative Pap/HPV results, revealed BV, next pap due in 1 year, needs rx sent to pharmacy. Patient stated she is alllergic to Metronidazole, causes facial swelling.  Patient informed will discuss with Efraim Kaufmann, FNP, request alternative rx.  Per Ilda Basset, FNP-BC, Rx Clindamycin vaginal cream sent to [facility]. Left message on voicemail alternative rx, Clindamycin vaginal cream sent to pharmacy, call as needed.     Attempted to contact patient regarding Pap/HPV results. Via, Katha Cabal [phone], [employer], Left message on voicemail requesting a return call.

## 2023-09-10 IMAGING — US US BREAST*R* LIMITED INC AXILLA
1 series · 4 of 4 positions shown · non-contrast
Comparison: Previous studies.

CLINICAL DATA: This 36-year-old female presenting for first
six-month follow-up of a probably benign right breast mass.

EXAM:
ULTRASOUND OF THE RIGHT BREAST

[Series 1: us breast*right* limited inc axilla · 0.06mm/px · 4 of 4 slices shown]
[im 1/4]
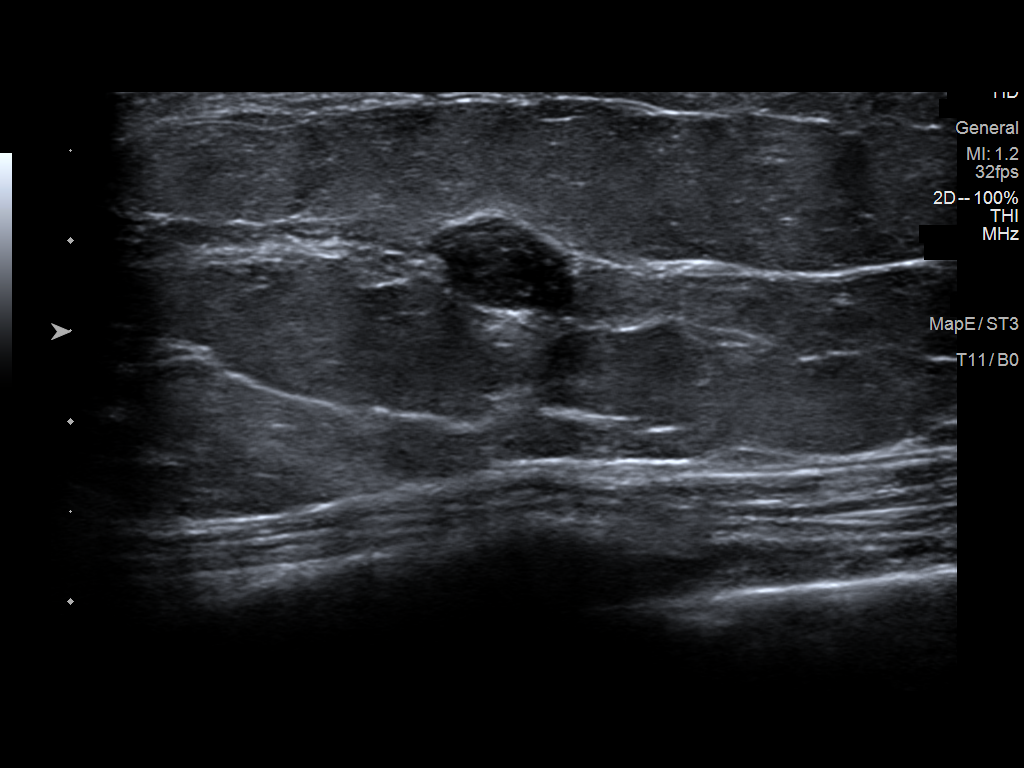
[im 2/4]
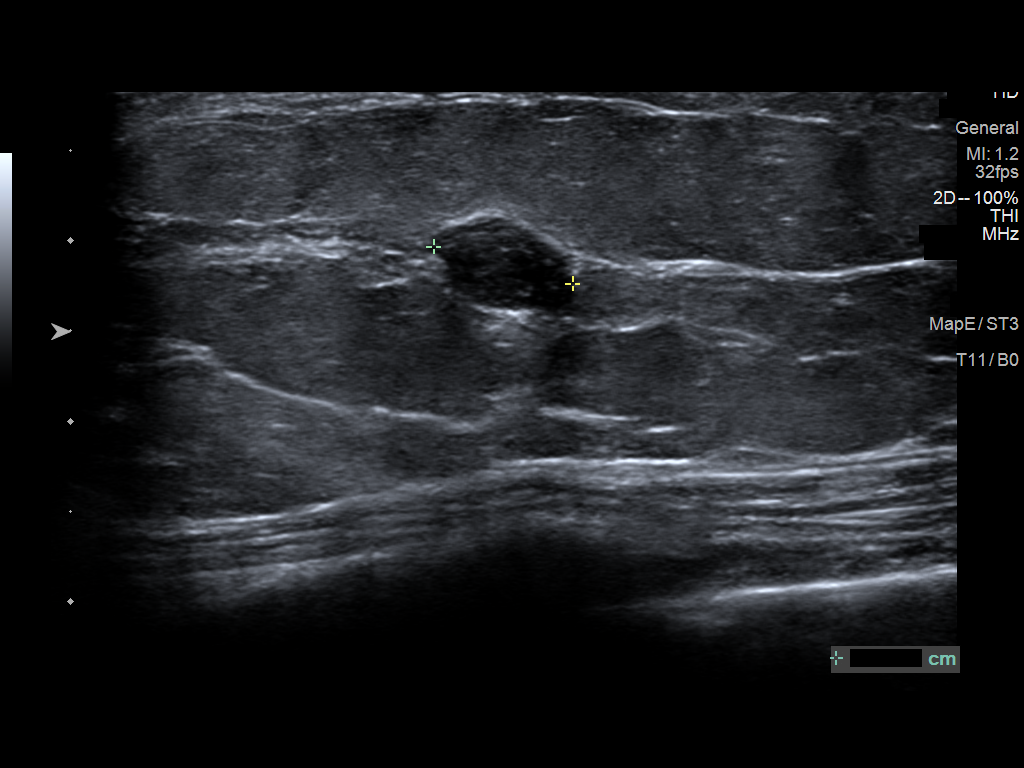
[im 3/4]
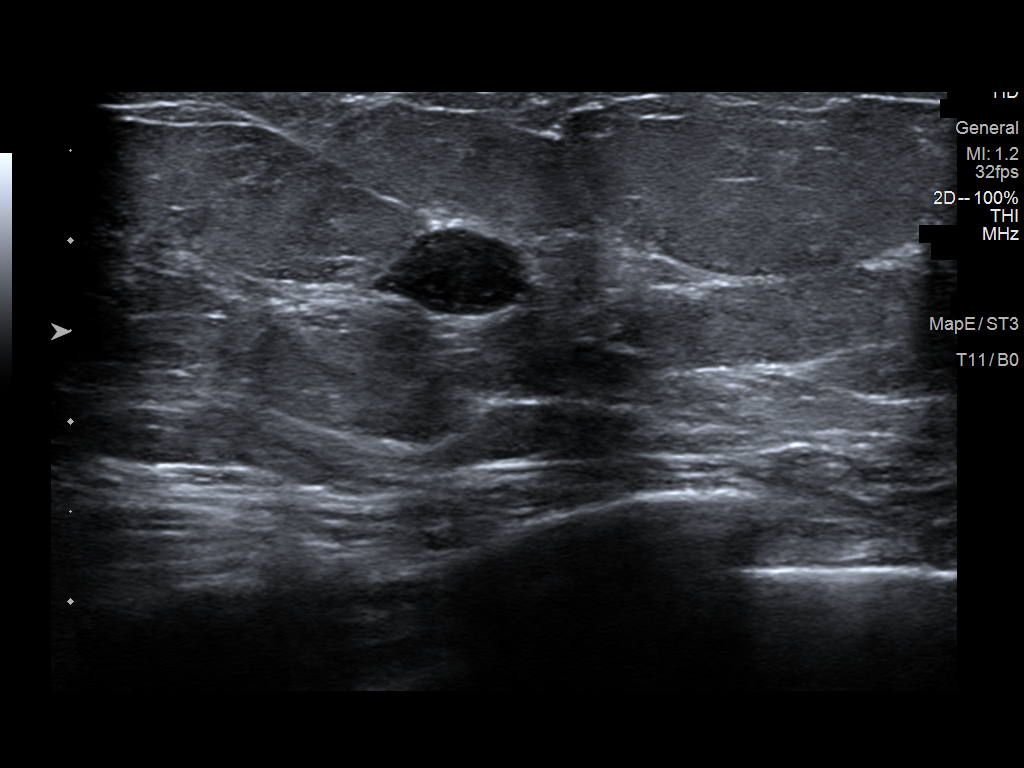
[im 4/4]
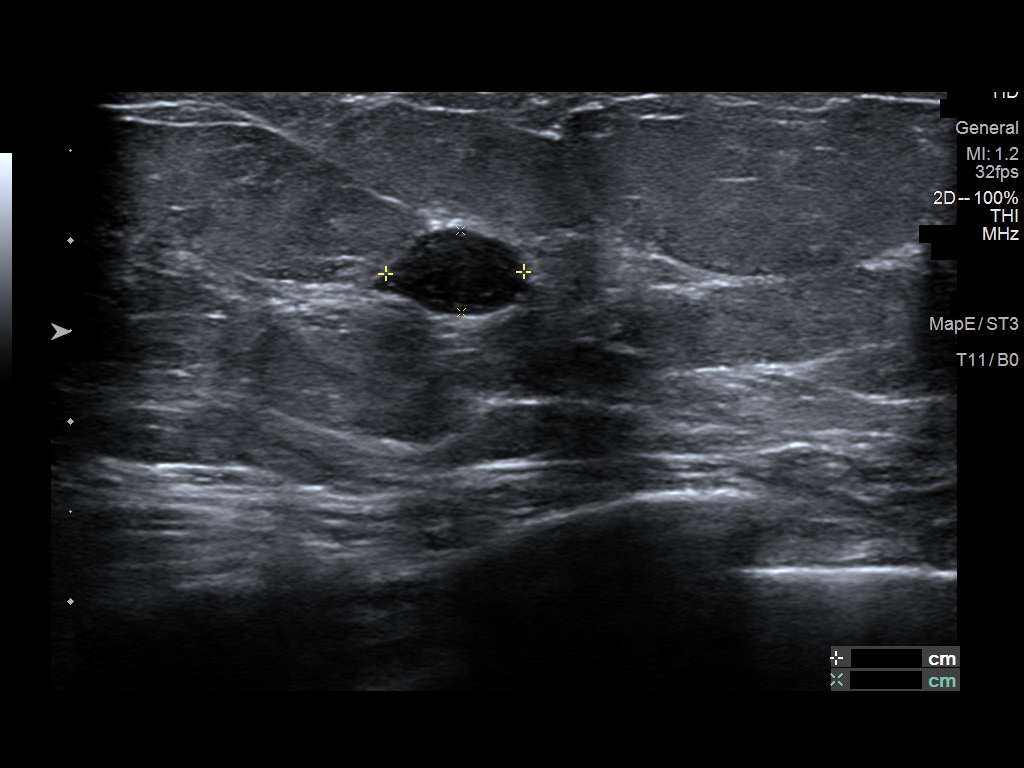

[4 of 4 positions shown; findings below may reference images not displayed]

FINDINGS: Targeted ultrasound is performed, showing stable appearance of an
oval, circumscribed hypoechoic mass at the 5 o'clock position 3 cm
from the nipple. It measures 8 x 5 x 8 mm (previously 8 x 4 x 8 mm).
Slight variations due to differences in positioning and technique.
IMPRESSION: Stable, probably benign right breast mass. Recommend continued
short-term follow-up.

RECOMMENDATION:
Right breast ultrasound in 6 months.

I have discussed the findings and recommendations with the patient.
If applicable, a reminder letter will be sent to the patient
regarding the next appointment.

BI-RADS CATEGORY  3: Probably benign.
